# Patient Record
Sex: Male | Born: 1965 | Race: White | Hispanic: No | Marital: Single | Smoking: Never smoker
Health system: Southern US, Community
[De-identification: ages and names within clinical notes are randomized; demographics above are authoritative.]

## PROBLEM LIST (undated history)

## (undated) DIAGNOSIS — I5022 Chronic systolic (congestive) heart failure: Secondary | ICD-10-CM

## (undated) DIAGNOSIS — E669 Obesity, unspecified: Secondary | ICD-10-CM

## (undated) DIAGNOSIS — Z87898 Personal history of other specified conditions: Secondary | ICD-10-CM

## (undated) DIAGNOSIS — I480 Paroxysmal atrial fibrillation: Secondary | ICD-10-CM

## (undated) DIAGNOSIS — Z9119 Patient's noncompliance with other medical treatment and regimen: Secondary | ICD-10-CM

## (undated) DIAGNOSIS — Z91199 Patient's noncompliance with other medical treatment and regimen due to unspecified reason: Secondary | ICD-10-CM

## (undated) DIAGNOSIS — F1491 Cocaine use, unspecified, in remission: Secondary | ICD-10-CM

## (undated) DIAGNOSIS — I10 Essential (primary) hypertension: Secondary | ICD-10-CM

## (undated) HISTORY — PX: CARPAL TUNNEL RELEASE: SHX101

---

## 2015-09-20 ENCOUNTER — Emergency Department (HOSPITAL_COMMUNITY): Payer: Self-pay

## 2015-09-20 ENCOUNTER — Encounter (HOSPITAL_COMMUNITY): Payer: Self-pay

## 2015-09-20 ENCOUNTER — Inpatient Hospital Stay (HOSPITAL_COMMUNITY)
Admission: EM | Admit: 2015-09-20 | Discharge: 2015-09-23 | DRG: 308 | Disposition: A | Discharge status: Home / Self Care | Payer: Self-pay | Attending: Interventional Cardiology | Admitting: Interventional Cardiology

## 2015-09-20 DIAGNOSIS — I428 Other cardiomyopathies: Secondary | ICD-10-CM | POA: Diagnosis present

## 2015-09-20 DIAGNOSIS — I253 Aneurysm of heart: Secondary | ICD-10-CM | POA: Diagnosis present

## 2015-09-20 DIAGNOSIS — I4581 Long QT syndrome: Secondary | ICD-10-CM | POA: Diagnosis present

## 2015-09-20 DIAGNOSIS — Z7901 Long term (current) use of anticoagulants: Secondary | ICD-10-CM

## 2015-09-20 DIAGNOSIS — Z9119 Patient's noncompliance with other medical treatment and regimen: Secondary | ICD-10-CM

## 2015-09-20 DIAGNOSIS — E669 Obesity, unspecified: Secondary | ICD-10-CM | POA: Diagnosis present

## 2015-09-20 DIAGNOSIS — I5023 Acute on chronic systolic (congestive) heart failure: Secondary | ICD-10-CM

## 2015-09-20 DIAGNOSIS — Z91199 Patient's noncompliance with other medical treatment and regimen due to unspecified reason: Secondary | ICD-10-CM

## 2015-09-20 DIAGNOSIS — I110 Hypertensive heart disease with heart failure: Secondary | ICD-10-CM | POA: Diagnosis present

## 2015-09-20 DIAGNOSIS — I10 Essential (primary) hypertension: Secondary | ICD-10-CM | POA: Diagnosis present

## 2015-09-20 DIAGNOSIS — Z6841 Body Mass Index (BMI) 40.0 and over, adult: Secondary | ICD-10-CM

## 2015-09-20 DIAGNOSIS — I4891 Unspecified atrial fibrillation: Secondary | ICD-10-CM | POA: Diagnosis present

## 2015-09-20 DIAGNOSIS — Q211 Atrial septal defect: Secondary | ICD-10-CM

## 2015-09-20 DIAGNOSIS — I480 Paroxysmal atrial fibrillation: Principal | ICD-10-CM | POA: Diagnosis present

## 2015-09-20 DIAGNOSIS — F1410 Cocaine abuse, uncomplicated: Secondary | ICD-10-CM | POA: Diagnosis present

## 2015-09-20 HISTORY — DX: Chronic systolic (congestive) heart failure: I50.22

## 2015-09-20 HISTORY — DX: Personal history of other specified conditions: Z87.898

## 2015-09-20 HISTORY — DX: Morbid (severe) obesity due to excess calories: E66.01

## 2015-09-20 HISTORY — DX: Patient's noncompliance with other medical treatment and regimen due to unspecified reason: Z91.199

## 2015-09-20 HISTORY — DX: Cocaine use, unspecified, in remission: F14.91

## 2015-09-20 HISTORY — DX: Obesity, unspecified: E66.9

## 2015-09-20 HISTORY — DX: Essential (primary) hypertension: I10

## 2015-09-20 HISTORY — DX: Patient's noncompliance with other medical treatment and regimen: Z91.19

## 2015-09-20 HISTORY — DX: Paroxysmal atrial fibrillation: I48.0

## 2015-09-20 LAB — TROPONIN I
TROPONIN I: 0.03 ng/mL (ref <0.031)
TROPONIN I: 0.03 ng/mL (ref <0.031)

## 2015-09-20 LAB — CBC
HEMATOCRIT: 47 % (ref 39.0–52.0)
HEMOGLOBIN: 16.7 g/dL (ref 13.0–17.0)
MCH: 30.2 pg (ref 26.0–34.0)
MCHC: 35.5 g/dL (ref 30.0–36.0)
MCV: 85 fL (ref 78.0–100.0)
Platelets: 228 10*3/uL (ref 150–400)
RBC: 5.53 MIL/uL (ref 4.22–5.81)
RDW: 15 % (ref 11.5–15.5)
WBC: 10.3 10*3/uL (ref 4.0–10.5)

## 2015-09-20 LAB — T4, FREE: FREE T4: 1.47 ng/dL — AB (ref 0.61–1.12)

## 2015-09-20 LAB — BASIC METABOLIC PANEL
ANION GAP: 9 (ref 5–15)
BUN: 16 mg/dL (ref 6–20)
CALCIUM: 8.9 mg/dL (ref 8.9–10.3)
CO2: 26 mmol/L (ref 22–32)
Chloride: 105 mmol/L (ref 101–111)
Creatinine, Ser: 0.98 mg/dL (ref 0.61–1.24)
GLUCOSE: 109 mg/dL — AB (ref 65–99)
POTASSIUM: 4.1 mmol/L (ref 3.5–5.1)
Sodium: 140 mmol/L (ref 135–145)

## 2015-09-20 LAB — HEPATIC FUNCTION PANEL
ALBUMIN: 3.6 g/dL (ref 3.5–5.0)
ALK PHOS: 42 U/L (ref 38–126)
ALT: 74 U/L — ABNORMAL HIGH (ref 17–63)
AST: 37 U/L (ref 15–41)
BILIRUBIN TOTAL: 1.7 mg/dL — AB (ref 0.3–1.2)
Bilirubin, Direct: 0.4 mg/dL (ref 0.1–0.5)
Indirect Bilirubin: 1.3 mg/dL — ABNORMAL HIGH (ref 0.3–0.9)
Total Protein: 6.1 g/dL — ABNORMAL LOW (ref 6.5–8.1)

## 2015-09-20 LAB — I-STAT TROPONIN, ED: TROPONIN I, POC: 0.01 ng/mL (ref 0.00–0.08)

## 2015-09-20 LAB — PROTIME-INR
INR: 1.3 (ref 0.00–1.49)
Prothrombin Time: 16.3 seconds — ABNORMAL HIGH (ref 11.6–15.2)

## 2015-09-20 LAB — HEPARIN LEVEL (UNFRACTIONATED)

## 2015-09-20 LAB — RAPID URINE DRUG SCREEN, HOSP PERFORMED
AMPHETAMINES: NOT DETECTED
Barbiturates: NOT DETECTED
Benzodiazepines: NOT DETECTED
Cocaine: NOT DETECTED
Opiates: NOT DETECTED
Tetrahydrocannabinol: NOT DETECTED

## 2015-09-20 LAB — BRAIN NATRIURETIC PEPTIDE: B Natriuretic Peptide: 513.7 pg/mL — ABNORMAL HIGH (ref 0.0–100.0)

## 2015-09-20 LAB — MAGNESIUM: Magnesium: 1.8 mg/dL (ref 1.7–2.4)

## 2015-09-20 LAB — TSH: TSH: 1.304 u[IU]/mL (ref 0.350–4.500)

## 2015-09-20 LAB — MRSA PCR SCREENING: MRSA by PCR: NEGATIVE

## 2015-09-20 MED ORDER — DILTIAZEM HCL 100 MG IV SOLR
5.0000 mg/h | INTRAVENOUS | Status: DC
  Administered 2015-09-20: 10 mg/h via INTRAVENOUS
  Administered 2015-09-20 – 2015-09-21 (×2): 15 mg/h via INTRAVENOUS
  Filled 2015-09-20 (×3): qty 100

## 2015-09-20 MED ORDER — DILTIAZEM LOAD VIA INFUSION
15.0000 mg | Freq: Once | INTRAVENOUS | Status: AC
  Administered 2015-09-20: 15 mg via INTRAVENOUS
  Filled 2015-09-20: qty 15

## 2015-09-20 MED ORDER — SODIUM CHLORIDE 0.9 % IJ SOLN
3.0000 mL | Freq: Two times a day (BID) | INTRAMUSCULAR | Status: DC
  Administered 2015-09-21 – 2015-09-22 (×3): 3 mL via INTRAVENOUS

## 2015-09-20 MED ORDER — ACETAMINOPHEN 325 MG PO TABS
650.0000 mg | ORAL_TABLET | ORAL | Status: DC | PRN
  Administered 2015-09-21 – 2015-09-22 (×2): 650 mg via ORAL
  Filled 2015-09-20 (×2): qty 2

## 2015-09-20 MED ORDER — METOPROLOL TARTRATE 1 MG/ML IV SOLN
5.0000 mg | INTRAVENOUS | Status: AC
  Administered 2015-09-20: 5 mg via INTRAVENOUS
  Filled 2015-09-20: qty 5

## 2015-09-20 MED ORDER — DILTIAZEM LOAD VIA INFUSION
20.0000 mg | Freq: Once | INTRAVENOUS | Status: DC
Start: 2015-09-20 — End: 2015-09-20
  Filled 2015-09-20: qty 20

## 2015-09-20 MED ORDER — DILTIAZEM HCL 100 MG IV SOLR: 5.0000 mg/h | INTRAVENOUS | Status: DC

## 2015-09-20 MED ORDER — FUROSEMIDE 10 MG/ML IJ SOLN
20.0000 mg | Freq: Once | INTRAMUSCULAR | Status: AC
  Administered 2015-09-20: 20 mg via INTRAVENOUS
  Filled 2015-09-20: qty 2

## 2015-09-20 MED ORDER — SODIUM CHLORIDE 0.9 % IJ SOLN: 3.0000 mL | INTRAMUSCULAR | Status: DC | PRN

## 2015-09-20 MED ORDER — HEPARIN BOLUS VIA INFUSION
4000.0000 [IU] | Freq: Once | INTRAVENOUS | Status: AC
  Administered 2015-09-20: 4000 [IU] via INTRAVENOUS
  Filled 2015-09-20: qty 4000

## 2015-09-20 MED ORDER — ONDANSETRON HCL 4 MG/2ML IJ SOLN
4.0000 mg | Freq: Four times a day (QID) | INTRAMUSCULAR | Status: DC | PRN
  Administered 2015-09-22: 4 mg via INTRAVENOUS

## 2015-09-20 MED ORDER — DILTIAZEM HCL 100 MG IV SOLR
5.0000 mg/h | INTRAVENOUS | Status: DC
  Administered 2015-09-20: 5 mg/h via INTRAVENOUS
  Filled 2015-09-20: qty 100

## 2015-09-20 MED ORDER — SODIUM CHLORIDE 0.9 % IV SOLN: 250.0000 mL | INTRAVENOUS | Status: DC | PRN

## 2015-09-20 MED ORDER — HEPARIN (PORCINE) IN NACL 100-0.45 UNIT/ML-% IJ SOLN
2300.0000 [IU]/h | INTRAMUSCULAR | Status: AC
  Administered 2015-09-20: 1500 [IU]/h via INTRAVENOUS
  Administered 2015-09-21: 1950 [IU]/h via INTRAVENOUS
  Administered 2015-09-21: 2250 [IU]/h via INTRAVENOUS
  Administered 2015-09-22 (×3): 2600 [IU]/h via INTRAVENOUS
  Filled 2015-09-20 (×7): qty 250

## 2015-09-20 MED ORDER — CARVEDILOL 3.125 MG PO TABS
3.1250 mg | ORAL_TABLET | Freq: Two times a day (BID) | ORAL | Status: DC
  Administered 2015-09-20 – 2015-09-21 (×2): 3.125 mg via ORAL
  Filled 2015-09-20 (×2): qty 1

## 2015-09-20 NOTE — ED Notes (Signed)
Pt ambulatory w/ steady gait to restroom. 

## 2015-09-20 NOTE — ED Notes (Signed)
Pt ambulatory w/ steady gait to restroom. 

## 2015-09-20 NOTE — ED Notes (Signed)
Admitting at bedside 

## 2015-09-20 NOTE — ED Notes (Signed)
EDP at bedside  

## 2015-09-20 NOTE — Progress Notes (Signed)
ANTICOAGULATION CONSULT NOTE  Pharmacy Consult for heparin Indication: atrial fibrillation  No Known Allergies  Patient Measurements: Height: 5\' 11"  (180.3 cm) Weight: (!) 323 lb 3.1 oz (146.6 kg) IBW/kg (Calculated) : 75.3 Heparin Dosing Weight: 106 kg  Vital Signs: Temp: 98.3 F (36.8 C) (11/14 2000) Temp Source: Oral (11/14 2000) BP: 135/108 mmHg (11/14 1900) Pulse Rate: 112 (11/14 1900)  Labs:  Recent Labs  09/20/15 1015 09/20/15 1818 09/20/15 2229  HGB 16.7  --   --   HCT 47.0  --   --   PLT 228  --   --   LABPROT  --  16.3*  --   INR  --  1.30  --   HEPARINUNFRC  --   --  <0.10*  CREATININE 0.98  --   --   TROPONINI  --  0.03 0.03    Estimated Creatinine Clearance: 132.4 mL/min (by C-G formula based on Cr of 0.98).  Assessment: 49 y.o. male with Afib for heparin  Goal of Therapy:  Heparin level 0.3-0.7 units/ml Monitor platelets by anticoagulation protocol: Yes   Plan:  Heparin 3000 units IV bolus, then increase heparin 1950 units/hr Follow-up am labs.   Geannie Risen, PharmD, BCPS   09/20/2015 11:59 PM

## 2015-09-20 NOTE — ED Notes (Signed)
Pt c/o intermittent shortness of breath x 2 weeks, unable to sleep d/t shortness of breath. Pt c/o nausea/upset stomach/bloated for 2 weeks. Pt hx a fib and htn. Pt was on xarelto, 2 bp meds, and lasix, baby aspirin - has not taken in 6 months.   Pt took 2 baby aspirins today.

## 2015-09-20 NOTE — H&P (Signed)
History and Physical  Patient ID: Bynum Reigel MRN: [id number], DOB: 03-14-1966 Date of Encounter: 09/20/2015, 2:02 PM Primary Physician: No PCP Per Patient Primary Cardiologist: New - patient lives between [place] and [place], but has been working in [employer] Complaint: SOB, orthopnea Reason for Admission: atrial fib RVR, CHF  HPI: Mr. Sahm is a 49 y/o M with history of essential HTN, atrial fib, morbid obesity, prior cocaine abuse, chronic systolic CHF (EF 12-24% in 08/2014) who presented to [facility] with atrial fib RVR. 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   In 08/2014, he was admitted for similar symptoms at [facility] in [place]. He was diagnosed with AF RVR in the setting of excess diet Coke - he had consumed more than usual as he'd taken 4 planes in 2 days for work meetings. UDS was also positive for cocaine during that admission per [facility]. He underwent TEE / DCCV the morning after admission 08/21/2014 with EF 30-35% / LV- normal size with moderate lobar hypokinesis / Mod MR / successful DCCV to normal sinus rhythm. He says he did not go back into atrial fib after the DCCV so it's unclear why Tikosyn specifically was chosen other than low EF. He recalls having a chemical test that did not show any blockages - hospital notes indicate CTA that ruled out PE, but plans for ischemic eval as outpatient. He was discharged with carvedilol, Tikosyn BID, enalapril, Lasix 20mg  daily, and Xarelto. He found it very difficult to keep up with the Tikosyn because of his travel since not every pharmacy carries it. He was on his meds for about 6 months but then lost his insurance, so stopped them about 6 months ago. He thought he had an episode of AF in January, but when he went to the ER to get checked out in [place], everything apparently looked OK. He has since cut down his diet Coke intake to 1/day. He  is forthcoming about the prior cocaine use but denies any recent use. He drinks 3-4 drinks (wine) on Friday and Saturday nights. No h/o tobacco and no known family history of heart disease.  Regarding this admission, he presented to the hospital today for evaluation of 2-3 weeks of progressive dyspnea and orthopnea. He has also had a vague intermittent chest pain without pattern. It is not particularly exertional in nature, tends to happen at random, and he says isn't something he's even paid that much attention to - it's more of the orthopnea that's really bothered him. He also notices when he eats, he gets gas easily. He's lost 10lbs in the last few weeks intentionally. He seems aware of his obesity and the need to lose weight. Labs are notable for glucose 109, BNP 513, troponin neg x 1, otherwise CBC/BMET unremarkable. CXR showed borderline enlarged cardiac silhouette, and suspected mild CHF. Presenting BP was 161/116 and HR 140s. He received 15mg  IV diltiazem along with a drip currently at 7.5mg /hr with HR 120s-140s. Gave 5mg  IV Lopressor with HR ?105. He states he currently feels better than when he first came in.   Past Medical History  Diagnosis Date  . Essential hypertension   . Paroxysmal atrial fibrillation (HCC)     a. Dx in [place] ([place]) in 2015, in setting of excess Diet Coke. s/p TEE/DCCV and then Tikosyn/Xarelto.  . Morbid obesity (HCC)   . Chronic systolic CHF (congestive heart failure) (HCC)  a. [place] 08/21/2014: TEE / DCCV- EF: 30-35% / LV- normal size with moderate lobar hypokinesis / Mod MR / Successful DCCV to normal sinus rhythm.  . History of cocaine use     Surgical History:  Past Surgical History  Procedure Laterality Date  . Carpal tunnel release       Home Meds: Prior to Admission medications   Not on File    Allergies: No Known Allergies  Social History   Social History  . Marital Status: Single    Spouse Name: N/A  . Number of Children: N/A  . Years of  Education: N/A   Occupational History  .      Travel Transport planner   Social History Main Topics  . Smoking status: Never Smoker   . Smokeless tobacco: Never Used  . Alcohol Use: 4.2 oz/week    7 Glasses of wine per week     Comment: Drinks Fri/Sat - 3-4 glasses of wine at a time  . Drug Use: Yes     Comment: H/o prior cocaine use  . Sexual Activity: Not on file   Other Topics Concern  . Not on file   Social History Narrative     Family History  Problem Relation Age of Onset  . Heart disease Neg Hx     Review of Systems: No bleeding, nausea, syncope. All other systems reviewed and are otherwise negative except as noted above.  Labs:   Lab Results  Component Value Date   WBC 10.3 09/20/2015   HGB 16.7 09/20/2015   HCT 47.0 09/20/2015   MCV 85.0 09/20/2015   PLT 228 09/20/2015     Recent Labs Lab 09/20/15 1015  NA 140  K 4.1  CL 105  CO2 26  BUN 16  CREATININE 0.98  CALCIUM 8.9  GLUCOSE 109*   Radiology/Studies:  Dg Chest Port 1 View  09/20/2015  CLINICAL DATA:  Short of breath.  Atrial fibrillation. EXAM: PORTABLE CHEST 1 VIEW COMPARISON:  None. FINDINGS: Cardiac silhouette borderline enlarged. No mediastinal or hilar masses or evidence of adenopathy. There is central and lung base interstitial prominence accentuated by low lung volumes. There is additional linear opacity at the right lung base that is likely due to atelectasis. No convincing pneumonia. No pleural effusion or pneumothorax. Bony thorax is demineralized but grossly intact. IMPRESSION: 1. Cardiac silhouette borderline enlarged. Central and lower lung zone interstitial prominence. Mild congestive heart failure suspected. No evidence of pneumonia. Electronically Signed   By: Amie Portland M.D.   On: 09/20/2015 11:28   Wt Readings from Last 3 Encounters:  09/20/15 300 lb (136.079 kg)    EKG: atrial fib 139bpm possible prior anteroseptal infarct, QTc by EKG readout, nonspecific ST-T  changes  Physical Exam: Blood pressure 114/90, pulse 119, temperature 98 F (36.7 C), temperature source Oral, resp. rate 23, height  (1.803 m), weight 300 lb (136.079 kg), SpO2 94 %. General: Well developed obese WM in no acute distress. Head: Normocephalic, atraumatic, sclera non-icteric, no xanthomas, nares are without discharge.  Neck: Negative for carotid bruits. JVD not elevated. Lungs: Bilateral crackles at bases without wheezes or rhonchi. Breathing is unlabored. Heart: Irregularly irregular, tachycardic, with S1 S2. No murmurs, rubs, or gallops appreciated. Abdomen: Soft, non-tender, non-distended with normoactive bowel sounds. No hepatomegaly. No rebound/guarding. No obvious abdominal masses. Msk:  Strength and tone appear normal for age. Extremities: No clubbing or cyanosis. No edema.  Distal pedal pulses are 2+ and equal bilaterally. Neuro: Alert  and oriented X 3. No focal deficit. No facial asymmetry. Moves all extremities spontaneously. Psych:  Responds to questions appropriately with a normal affect.    ASSESSMENT AND PLAN:   1. Paroxysmal atrial fibrillation with RVR - suspect recent symptoms are due to AF contributing to CHF. Will cycle troponins, check A1C/Mg/TSH/UDS, and check lipids in AM. CHADSVASC 2. Will place on IV heparin per pharmacy and consult care management to determine the best agent cost-wise for him given that he has no insurance until January. Coumadin would be difficult for him to manage with his frequent travel schedule and office copays. Continue diltiazem drip for now and add back low-dose beta blocker. Long-term, diltiazem is not the best choice with his LV dysfunction but will continue it acutely to see if he converts on his own overnight. If he continues to feel better, can consider outpatient DCCV once compliance with anticoagulation is established. If there is evidence of worsening decompensation then we can consider TEE/DCCV this admission.   2.  Acute on chronic systolic CHF - will give  IV Lasix in the ER. Suspect this will improve with better rate control. Will recheck echo when HR lower to re-assess LV function. He will need ischemic evaluation at some point - will d/w MD. Other etiologies include tachy-mediated cardiomyopathy or related to prior cocaine abuse. Anticipate resuming ACEI once we see good control of his HR.  3. Essential HTN - follow BP with above measures.   4. Morbid obesity (BMI 41.9) - he is eager to hear that once this is settled out, he will be allowed to work out again. He understands that his weight carries substantial risk to recurrent arrhythmia if not controlled. Would benefit from sleep study as outpatient if we can arrange, given PAF.  5. Prior cocaine abuse - check UDS. He denies any recent or active use.  Signed, Laurann Montana PA-C 09/20/2015, 2:02 PM Pager: [phone]  I have examined the patient and reviewed assessment and plan and discussed with patient.  Agree with above as stated.  Patient with recurret AFib.  Difficult social situation due to his travel and h/o drug use.  He needs to avoid drugs.  He wants to lose weight and exerise.  First step is to rate control and diurese.  Will need to get him on anticoagulation long term.  He was on Xarelto in the past.  Will try to get a discount card.  He may need Eliquis instead since he has taken Xarelto in the past. Hopefully, he will convert on his own.  Once stable, I would consider outpatient ischemic testing.  Kizzy Olafson S.

## 2015-09-20 NOTE — ED Provider Notes (Signed)
CSN: [id number]     Arrival date & time 09/20/15  1000 History   First MD Initiated Contact with Patient 09/20/15 1014     Chief Complaint  Patient presents with  . Shortness of Breath  . Atrial Fibrillation     The history is provided by the patient. No language interpreter was used.   Luke Butler is a 49 year old man with history of atrial fibrillation here for evaluation of shortness of breath. He has been off of his medications for A. fib for the last 6 months due to insurance reasons. For the last 2-1/2-3 weeks he reports increased shortness of breath, rapid heart rate, orthopnea and dyspnea on exertion. He denies any fevers, lower extremity edema, cough. He does have difficulty with upset stomach/nausea with meals. He is taking 2 baby aspirins twice a day, no additional medications. He drinks alcohol on the weekends, none during the week. Symptoms are moderate, constant, worsening.  Past Medical History  Diagnosis Date  . Hypertension   . Atrial fibrillation (HCC)    History reviewed. No pertinent past surgical history. History reviewed. No pertinent family history. Social History  Substance Use Topics  . Smoking status: Never Smoker   . Smokeless tobacco: Never Used  . Alcohol Use: 4.2 oz/week    7 Glasses of wine per week    Review of Systems  All other systems reviewed and are negative.     Allergies  Review of patient's allergies indicates no known allergies.  Home Medications   Prior to Admission medications   Not on File   BP 161/116 mmHg  Pulse 138  Temp(Src) 98 F (36.7 C) (Oral)  Resp 20  Ht 5\' 11"  (1.803 m)  Wt 300 lb (136.079 kg)  BMI 41.86 kg/m2  SpO2 97% Physical Exam  Constitutional: He is oriented to person, place, and time. He appears well-developed and well-nourished.  HENT:  Head: Normocephalic and atraumatic.  Cardiovascular:  No murmur heard. Tachycardic and irregular  Pulmonary/Chest: Effort normal and breath sounds normal. No  respiratory distress.  Abdominal: Soft. There is no tenderness. There is no rebound and no guarding.  Musculoskeletal: He exhibits no tenderness.  1+ pitting edema in bilateral lower extremities  Neurological: He is alert and oriented to person, place, and time.  Skin: Skin is warm and dry.  Psychiatric:  Anxious appearing  Nursing note and vitals reviewed.   ED Course  Procedures (including critical care time) Labs Review Labs Reviewed  BASIC METABOLIC PANEL - Abnormal; Notable for the following:    Glucose, Bld 109 (*)    All other components within normal limits  BRAIN NATRIURETIC PEPTIDE - Abnormal; Notable for the following:    B Natriuretic Peptide 513.7 (*)    All other components within normal limits  MRSA PCR SCREENING  CBC  URINE RAPID DRUG SCREEN, HOSP PERFORMED  TSH  T4, FREE  MAGNESIUM  TROPONIN I  TROPONIN I  TROPONIN I  HEMOGLOBIN A1C  PROTIME-INR  HEPATIC FUNCTION PANEL  HEPARIN LEVEL (UNFRACTIONATED)  BASIC METABOLIC PANEL  LIPID PANEL  CBC  HEPARIN LEVEL (UNFRACTIONATED)  Rosezena Sensor, ED    Imaging Review Dg Chest Port 1 View  09/20/2015  CLINICAL DATA:  Short of breath.  Atrial fibrillation. EXAM: PORTABLE CHEST 1 VIEW COMPARISON:  None. FINDINGS: Cardiac silhouette borderline enlarged. No mediastinal or hilar masses or evidence of adenopathy. There is central and lung base interstitial prominence accentuated by low lung volumes. There is additional linear opacity at the right  lung base that is likely due to atelectasis. No convincing pneumonia. No pleural effusion or pneumothorax. Bony thorax is demineralized but grossly intact. IMPRESSION: 1. Cardiac silhouette borderline enlarged. Central and lower lung zone interstitial prominence. Mild congestive heart failure suspected. No evidence of pneumonia. Electronically Signed   By: Amie Portland M.D.   On: 09/20/2015 11:28   I have personally reviewed and evaluated these images and lab results as  part of my medical decision-making.   EKG Interpretation   Date/Time:  Monday September 20 2015 10:09:59 EST Ventricular Rate:  139 PR Interval:    QRS Duration: 87 QT Interval:  355 QTC Calculation: 540 R Axis:   64 Text Interpretation:  Atrial fibrillation Anteroseptal infarct, age  indeterminate Borderline repolarization abnormality Prolonged QT interval  Confirmed by Lincoln Brigham [phone]) on 09/20/2015 10:34:05 AM      MDM   Final diagnoses:  Atrial fibrillation with RVR [facility])    Patient here for evaluation of shortness of breath. The patient is in A. fib with RVR with mild congestive heart failure. Rate is improved on diltiazem drip after bolus and drip but is still not completely controlled. Discussed with cardiology regarding admission for further treatment.    Tilden Fossa, MD 09/20/15 [phone]

## 2015-09-20 NOTE — Progress Notes (Signed)
ANTICOAGULATION CONSULT NOTE - Initial Consult  Pharmacy Consult for heparin Indication: atrial fibrillation  No Known Allergies  Patient Measurements: Height: 5\' 11"  (180.3 cm) Weight: 300 lb (136.079 kg) IBW/kg (Calculated) : 75.3 Heparin Dosing Weight: 106 kg  Vital Signs: Temp: 98 F (36.7 C) (11/14 1011) Temp Source: Oral (11/14 1011) BP: 120/81 mmHg (11/14 1611) Pulse Rate: 121 (11/14 1611)  Labs:  Recent Labs  09/20/15 1015  HGB 16.7  HCT 47.0  PLT 228  CREATININE 0.98    Estimated Creatinine Clearance: 127 mL/min (by C-G formula based on Cr of 0.98).   Medical History: Past Medical History  Diagnosis Date  . Essential hypertension   . Paroxysmal atrial fibrillation (HCC)     a. Dx in [place] ([place] in [place], in setting of excess Diet Coke. s/p TEE/DCCV and then Tikosyn/Xarelto.  . Morbid obesity (HCC)   . Chronic systolic CHF (congestive heart failure) (HCC)     a. [place] 08/21/2014: TEE / DCCV- EF: 30-35% / LV- normal size with moderate lobar hypokinesis / Mod MR / Successful DCCV to normal sinus rhythm.  . History of cocaine use     Assessment: 49 yo m presenting to the ED on 11/14 in afib with RVR.  Pharmacy is consulted to dose heparin for afib. Patient is on Tikosyn and Xarelto at home, but has not taken his medications in ~6 months.  Hgb 16.7, plts 228.   Goal of Therapy:  Heparin level 0.3-0.7 units/ml Monitor platelets by anticoagulation protocol: Yes   Plan:  Heparin bolus 4,000 units x 1 Heparin infusion 1500 units/hr  6-hr HL @ 2300 Daily HL, CBC Monitor s/sx of bleeding  Cassie L. Roseanne Reno, PharmD Clinical Pharmacy Resident Pager: [phone] 09/20/2015 4:45 PM

## 2015-09-21 ENCOUNTER — Inpatient Hospital Stay (HOSPITAL_COMMUNITY): Payer: Self-pay

## 2015-09-21 DIAGNOSIS — I4891 Unspecified atrial fibrillation: Secondary | ICD-10-CM

## 2015-09-21 LAB — LIPID PANEL
CHOL/HDL RATIO: 4.2 ratio
CHOLESTEROL: 131 mg/dL (ref 0–200)
HDL: 31 mg/dL — AB (ref >40)
LDL Cholesterol: 85 mg/dL (ref 0–99)
Triglycerides: 76 mg/dL (ref <150)
VLDL: 15 mg/dL (ref 0–40)

## 2015-09-21 LAB — BASIC METABOLIC PANEL
ANION GAP: 10 (ref 5–15)
BUN: 11 mg/dL (ref 6–20)
CHLORIDE: 103 mmol/L (ref 101–111)
CO2: 25 mmol/L (ref 22–32)
Calcium: 8.7 mg/dL — ABNORMAL LOW (ref 8.9–10.3)
Creatinine, Ser: 0.91 mg/dL (ref 0.61–1.24)
GFR calc non Af Amer: >60 mL/min (ref >60)
Glucose, Bld: 95 mg/dL (ref 65–99)
POTASSIUM: 4.3 mmol/L (ref 3.5–5.1)
SODIUM: 138 mmol/L (ref 135–145)

## 2015-09-21 LAB — CBC
HEMATOCRIT: 44.3 % (ref 39.0–52.0)
HEMOGLOBIN: 15.3 g/dL (ref 13.0–17.0)
MCH: 29.4 pg (ref 26.0–34.0)
MCHC: 34.5 g/dL (ref 30.0–36.0)
MCV: 85.2 fL (ref 78.0–100.0)
PLATELETS: 198 10*3/uL (ref 150–400)
RBC: 5.2 MIL/uL (ref 4.22–5.81)
RDW: 14.7 % (ref 11.5–15.5)
WBC: 7.4 10*3/uL (ref 4.0–10.5)

## 2015-09-21 LAB — HEPARIN LEVEL (UNFRACTIONATED)
HEPARIN UNFRACTIONATED: 0.14 [IU]/mL — AB (ref 0.30–0.70)
Heparin Unfractionated: 0.15 IU/mL — ABNORMAL LOW (ref 0.30–0.70)

## 2015-09-21 LAB — HEMOGLOBIN A1C
HEMOGLOBIN A1C: 5 % (ref 4.8–5.6)
Mean Plasma Glucose: 97 mg/dL

## 2015-09-21 LAB — TROPONIN I: Troponin I: <0.03 ng/mL (ref <0.031)

## 2015-09-21 MED ORDER — CARVEDILOL 6.25 MG PO TABS
6.2500 mg | ORAL_TABLET | Freq: Two times a day (BID) | ORAL | Status: DC
  Administered 2015-09-21: 3.125 mg via ORAL
  Administered 2015-09-21 – 2015-09-22 (×2): 6.25 mg via ORAL
  Filled 2015-09-21 (×3): qty 1

## 2015-09-21 MED ORDER — HEPARIN BOLUS VIA INFUSION
2000.0000 [IU] | Freq: Once | INTRAVENOUS | Status: DC
  Filled 2015-09-21: qty 2000

## 2015-09-21 MED ORDER — SODIUM CHLORIDE 0.9 % IV SOLN: 250.0000 mL | INTRAVENOUS | Status: DC

## 2015-09-21 MED ORDER — FUROSEMIDE 10 MG/ML IJ SOLN
40.0000 mg | Freq: Once | INTRAMUSCULAR | Status: AC
  Administered 2015-09-21: 40 mg via INTRAVENOUS
  Filled 2015-09-21: qty 4

## 2015-09-21 MED ORDER — HEPARIN BOLUS VIA INFUSION
2000.0000 [IU] | Freq: Once | INTRAVENOUS | Status: AC
  Administered 2015-09-21: 2000 [IU] via INTRAVENOUS
  Filled 2015-09-21: qty 2000

## 2015-09-21 MED ORDER — PERFLUTREN LIPID MICROSPHERE
INTRAVENOUS | Status: AC
  Administered 2015-09-21: 2 mL
  Filled 2015-09-21: qty 10

## 2015-09-21 MED ORDER — SODIUM CHLORIDE 0.9 % IJ SOLN
3.0000 mL | Freq: Two times a day (BID) | INTRAMUSCULAR | Status: DC
  Administered 2015-09-21 – 2015-09-22 (×2): 3 mL via INTRAVENOUS

## 2015-09-21 MED ORDER — SODIUM CHLORIDE 0.9 % IJ SOLN: 3.0000 mL | INTRAMUSCULAR | Status: DC | PRN

## 2015-09-21 MED ORDER — HEPARIN BOLUS VIA INFUSION
3000.0000 [IU] | Freq: Once | INTRAVENOUS | Status: AC
  Administered 2015-09-21: 3000 [IU] via INTRAVENOUS
  Filled 2015-09-21: qty 3000

## 2015-09-21 MED ORDER — HYDROCORTISONE 1 % EX CREA
1.0000 "application " | TOPICAL_CREAM | Freq: Three times a day (TID) | CUTANEOUS | Status: DC | PRN
  Filled 2015-09-21: qty 28

## 2015-09-21 MED ORDER — DILTIAZEM HCL 60 MG PO TABS
90.0000 mg | ORAL_TABLET | Freq: Four times a day (QID) | ORAL | Status: DC
  Administered 2015-09-21 – 2015-09-22 (×7): 90 mg via ORAL
  Filled 2015-09-21 (×11): qty 1

## 2015-09-21 NOTE — Progress Notes (Signed)
SUBJECTIVE:  Feels better, but has palpitations if he walks to the bathroom.  OBJECTIVE:   Vitals:   Filed Vitals:   09/21/15 0010 09/21/15 0329 09/21/15 0740 09/21/15 1126  BP: 123/86 114/99 132/98 137/98  Pulse:      Temp:  98.1 F (36.7 C) 97.9 F (36.6 C) 98.1 F (36.7 C)  TempSrc:  Oral Oral Oral  Resp:  Height:      Weight:  322 lb 1.5 oz (146.1 kg)    SpO2: 93% 95% 96% 97%   I&O's:   Intake/Output Summary (Last 24 hours) at 09/21/15 1138 Last data filed at 09/21/15 0800  Gross per 24 hour  Intake 1548.21 ml  Output   2475 ml  Net -926.79 ml   TELEMETRY: Reviewed telemetry pt in AFib, intermittent RVR:     PHYSICAL EXAM General: Well developed, well nourished, in no acute distress Head:   Normal cephalic and atramatic  Lungs:   Clear bilaterally to auscultation. Heart:   Irregularly irregular S1 S2  No JVD.   Abdomen: abdomen soft and non-tender Msk:  Back normal,  Normal strength and tone for age. Extremities:   No edema.   Neuro: Alert and oriented. Psych:  Normal affect, responds appropriately Skin: No rash   LABS: Basic Metabolic Panel:  Recent Labs  16/10/96 1015 09/20/15 1818 09/21/15 0755  NA 140  --  138  K 4.1  --  4.3  CL 105  --  103  CO2 26  --  25  GLUCOSE 109*  --  95  BUN 16  --  11  CREATININE 0.98  --  0.91  CALCIUM 8.9  --  8.7*  MG  --  1.8  --    Liver Function Tests:  Recent Labs  09/20/15 1818  AST 37  ALT 74*  ALKPHOS 42  BILITOT 1.7*  PROT 6.1*  ALBUMIN 3.6   No results for input(s): LIPASE, AMYLASE in the last 72 hours. CBC:  Recent Labs  09/20/15 1015 09/21/15 0755  WBC 10.3 7.4  HGB 16.7 15.3  HCT 47.0 44.3  MCV 85.0 85.2  PLT 228 198   Cardiac Enzymes:  Recent Labs  09/20/15 1818 09/20/15 2229 09/21/15 0755  TROPONINI 0.03 0.03 <0.03   BNP: Invalid input(s): POCBNP D-Dimer: No results for input(s): DDIMER in the last 72 hours. Hemoglobin A1C:  Recent Labs   09/20/15 1818  HGBA1C 5.0   Fasting Lipid Panel:  Recent Labs  09/21/15 0735  CHOL 131  HDL 31*  LDLCALC 85  TRIG 76  CHOLHDL 4.2   Thyroid Function Tests:  Recent Labs  09/20/15 1818  TSH 1.304   Anemia Panel: No results for input(s): VITAMINB12, FOLATE, FERRITIN, TIBC, IRON, RETICCTPCT in the last 72 hours. Coag Panel:   Lab Results  Component Value Date   INR 1.30 09/20/2015    RADIOLOGY: Dg Chest Port 1 View  09/20/2015  CLINICAL DATA:  Short of breath.  Atrial fibrillation. EXAM: PORTABLE CHEST 1 VIEW COMPARISON:  None. FINDINGS: Cardiac silhouette borderline enlarged. No mediastinal or hilar masses or evidence of adenopathy. There is central and lung base interstitial prominence accentuated by low lung volumes. There is additional linear opacity at the right lung base that is likely due to atelectasis. No convincing pneumonia. No pleural effusion or pneumothorax. Bony thorax is demineralized but grossly intact. IMPRESSION: 1. Cardiac silhouette borderline enlarged. Central and lower lung zone interstitial prominence. Mild congestive heart failure suspected.  No evidence of pneumonia. Electronically Signed   By: Amie Portland M.D.   On: 09/20/2015 11:28      ASSESSMENT /PLAN:    1) AFib: Somewhat difficult to rate control.  Increase Coreg to 6.25 BID.  Change dilt to oral.  Plan for TEE/CV tomorrow.  We stressed the importance of longterm anticoagulation.  Case manager is working on getting him free NOAC.  Continue IV heparin for now.  NOAC to start post cardioversion.  2) Echo pending.  Prior low EF.  Additional dose of Lasix given.  Check BMet in AM.  Longterm, he needs weight loss.    This patients CHA2DS2-VASc Score and unadjusted Ischemic Stroke Rate (% per year) is equal to 2.2 % stroke rate/year from a score of 2  Above score calculated as 1 point each if present [CHF, HTN, DM, Vascular=MI/PAD/Aortic Plaque, Age if 65-74, or Male] Above score calculated  as 2 points each if present [Age > 75, or Stroke/TIA/TE]    Corky Crafts, MD  09/21/2015  11:38 AM

## 2015-09-21 NOTE — Progress Notes (Signed)
Echocardiogram 2D Echocardiogram with Definity has been performed.  Nolon Rod 09/21/2015, 2:10 PM

## 2015-09-21 NOTE — Discharge Instructions (Signed)

## 2015-09-21 NOTE — Progress Notes (Signed)
ANTICOAGULATION CONSULT NOTE  Pharmacy Consult for heparin Indication: atrial fibrillation  No Known Allergies  Patient Measurements: Height: 5\' 11"  (180.3 cm) Weight: (!) 322 lb 1.5 oz (146.1 kg) IBW/kg (Calculated) : 75.3 Heparin Dosing Weight: 106 kg  Vital Signs: Temp: 98.1 F (36.7 C) (11/15 1700) Temp Source: Oral (11/15 1700) BP: 134/99 mmHg (11/15 1700)  Labs:  Recent Labs  09/20/15 1015 09/20/15 1818 09/20/15 2229 09/21/15 0755 09/21/15 0848 09/21/15 1610  HGB 16.7  --   --  15.3  --   --   HCT 47.0  --   --  44.3  --   --   PLT 228  --   --  198  --   --   LABPROT  --  16.3*  --   --   --   --   INR  --  1.30  --   --   --   --   HEPARINUNFRC  --   --  <0.10*  --  0.14* 0.15*  CREATININE 0.98  --   --  0.91  --   --   TROPONINI  --  0.03 0.03 <0.03  --   --     Estimated Creatinine Clearance: 142.3 mL/min (by C-G formula based on Cr of 0.91).  Assessment: 49 y.o. male with Afib currently on heparin drip 2250 uts/hr HL remains < goal 0.15.  CBC stable no bleeding noted.   Goal of Therapy:  Heparin level 0.3-0.7 units/ml Monitor platelets by anticoagulation protocol: Yes   Plan:  Heparin 2000 units IV bolus, then increase heparin 2600 units/hr Check daily HL, CBC   Leota Sauers Pharm.D. CPP, BCPS Clinical Pharmacist [phone] 09/21/2015 6:29 PM

## 2015-09-21 NOTE — Progress Notes (Signed)
ANTICOAGULATION CONSULT NOTE  Pharmacy Consult for heparin Indication: atrial fibrillation  No Known Allergies  Patient Measurements: Height: 5\' 11"  (180.3 cm) Weight: (!) 322 lb 1.5 oz (146.1 kg) IBW/kg (Calculated) : 75.3 Heparin Dosing Weight: 106 kg  Vital Signs: Temp: 97.9 F (36.6 C) (11/15 0740) Temp Source: Oral (11/15 0740) BP: 132/98 mmHg (11/15 0740)  Labs:  Recent Labs  09/20/15 1015 09/20/15 1818 09/20/15 2229 09/21/15 0755 09/21/15 0848  HGB 16.7  --   --  15.3  --   HCT 47.0  --   --  44.3  --   PLT 228  --   --  198  --   LABPROT  --  16.3*  --   --   --   INR  --  1.30  --   --   --   HEPARINUNFRC  --   --  <0.10*  --  0.14*  CREATININE 0.98  --   --  0.91  --   TROPONINI  --  0.03 0.03 <0.03  --     Estimated Creatinine Clearance: 142.3 mL/min (by C-G formula based on Cr of 0.91).  Assessment: 49 y.o. male with Afib currently on heparin. Follow up level this am is still below goal at 0.14. No bleeding issues noted, CBC wnl. Will increase rate but plan to transition to DOAC soon based on cost/help from case management.  Goal of Therapy:  Heparin level 0.3-0.7 units/ml Monitor platelets by anticoagulation protocol: Yes   Plan:  Heparin 2000 units IV bolus, then increase heparin 2250 units/hr Check HL this afternoon if continued Follow up transition to oral [facility]  Sheppard Coil PharmD., BCPS Clinical Pharmacist Pager [phone] 09/21/2015 10:07 AM

## 2015-09-21 NOTE — Care Management Note (Signed)
Case Management Note  Patient Details  Name: Tearle Ciccone MRN: [id number] Date of Birth: 06-May-1966  Subjective/Objective:     Adm w at fib               Action/Plan: lives in [place] w friend, travels as Tax adviser. Changed jobs recently and ins does not start til jan 2017.   Expected Discharge Date:                  Expected Discharge Plan:  Home/Self Care  In-House Referral:     Discharge planning Services  CM Consult, Indigent Health Clinic, Medication Assistance  Post Acute Care Choice:    Choice offered to:     DME Arranged:    DME Agency:     HH Arranged:    HH Agency:     Status of Service:     Medicare Important Message Given:    Date Medicare IM Given:    Medicare IM give by:    Date Additional Medicare IM Given:    Additional Medicare Important Message give by:     If discussed at Long Length of Stay Meetings, dates discussed:    Additional Comments: ur review. Gave pt 30day free and copay cards for eliquis and xarelto. Left pt pt assist forms for both meds. He will have about 2weeks without ins after 30day free cards run out. He may just pruchase 2 weeks worth but has pt assist forms. Gave pt inform on [place] and wellness clinic. He travels to [place] to work and will follow up w cardiol here and may use clinic for pcp.   Hanley Hays, RN 09/21/2015, 10:43 AM

## 2015-09-22 ENCOUNTER — Inpatient Hospital Stay (HOSPITAL_COMMUNITY): Payer: Self-pay

## 2015-09-22 ENCOUNTER — Inpatient Hospital Stay (HOSPITAL_COMMUNITY): Payer: Self-pay | Admitting: Anesthesiology

## 2015-09-22 ENCOUNTER — Encounter (HOSPITAL_COMMUNITY): Payer: Self-pay

## 2015-09-22 ENCOUNTER — Encounter (HOSPITAL_COMMUNITY)
Admission: EM | Disposition: A | Discharge status: Home / Self Care | Payer: Self-pay | Source: Home / Self Care | Attending: Interventional Cardiology

## 2015-09-22 DIAGNOSIS — I4891 Unspecified atrial fibrillation: Secondary | ICD-10-CM

## 2015-09-22 HISTORY — PX: CARDIOVERSION: SHX1299

## 2015-09-22 HISTORY — PX: TEE WITHOUT CARDIOVERSION: SHX5443

## 2015-09-22 LAB — CBC
HEMATOCRIT: 42.3 % (ref 39.0–52.0)
Hemoglobin: 14.9 g/dL (ref 13.0–17.0)
MCH: 30 pg (ref 26.0–34.0)
MCHC: 35.2 g/dL (ref 30.0–36.0)
MCV: 85.1 fL (ref 78.0–100.0)
Platelets: 207 10*3/uL (ref 150–400)
RBC: 4.97 MIL/uL (ref 4.22–5.81)
RDW: 15 % (ref 11.5–15.5)
WBC: 7.7 10*3/uL (ref 4.0–10.5)

## 2015-09-22 LAB — BASIC METABOLIC PANEL
Anion gap: 10 (ref 5–15)
BUN: 15 mg/dL (ref 6–20)
CHLORIDE: 103 mmol/L (ref 101–111)
CO2: 26 mmol/L (ref 22–32)
CREATININE: 1.02 mg/dL (ref 0.61–1.24)
Calcium: 8.6 mg/dL — ABNORMAL LOW (ref 8.9–10.3)
GFR calc Af Amer: >60 mL/min (ref >60)
GFR calc non Af Amer: >60 mL/min (ref >60)
Glucose, Bld: 110 mg/dL — ABNORMAL HIGH (ref 65–99)
Potassium: 3.8 mmol/L (ref 3.5–5.1)
Sodium: 139 mmol/L (ref 135–145)

## 2015-09-22 LAB — HEPARIN LEVEL (UNFRACTIONATED): Heparin Unfractionated: 0.34 IU/mL (ref 0.30–0.70)

## 2015-09-22 SURGERY — ECHOCARDIOGRAM, TRANSESOPHAGEAL
Anesthesia: General

## 2015-09-22 MED ORDER — LIDOCAINE HCL (CARDIAC) 20 MG/ML IV SOLN
INTRAVENOUS | Status: DC | PRN
  Administered 2015-09-22: 80 mg via INTRAVENOUS

## 2015-09-22 MED ORDER — FENTANYL CITRATE (PF) 100 MCG/2ML IJ SOLN: 25.0000 ug | INTRAMUSCULAR | Status: DC | PRN

## 2015-09-22 MED ORDER — ONDANSETRON HCL 4 MG/2ML IJ SOLN: 4.0000 mg | Freq: Once | INTRAMUSCULAR | Status: DC | PRN

## 2015-09-22 MED ORDER — FENTANYL CITRATE (PF) 250 MCG/5ML IJ SOLN
INTRAMUSCULAR | Status: DC | PRN
  Administered 2015-09-22: 50 ug via INTRAVENOUS
  Administered 2015-09-22: 100 ug via INTRAVENOUS

## 2015-09-22 MED ORDER — LACTATED RINGERS IV SOLN
INTRAVENOUS | Status: DC | PRN
  Administered 2015-09-22: 14:00:00 via INTRAVENOUS

## 2015-09-22 MED ORDER — SUCCINYLCHOLINE CHLORIDE 20 MG/ML IJ SOLN
INTRAMUSCULAR | Status: DC | PRN
  Administered 2015-09-22: 120 mg via INTRAVENOUS

## 2015-09-22 MED ORDER — SODIUM CHLORIDE 0.9 % IV SOLN: INTRAVENOUS | Status: DC

## 2015-09-22 MED ORDER — OXYCODONE HCL 5 MG/5ML PO SOLN: 5.0000 mg | Freq: Once | ORAL | Status: DC | PRN

## 2015-09-22 MED ORDER — CARVEDILOL 12.5 MG PO TABS
12.5000 mg | ORAL_TABLET | Freq: Two times a day (BID) | ORAL | Status: DC
Start: 2015-09-22 — End: 2015-09-23
  Administered 2015-09-22 – 2015-09-23 (×2): 12.5 mg via ORAL
  Filled 2015-09-22 (×2): qty 1

## 2015-09-22 MED ORDER — WARFARIN - PHYSICIAN DOSING INPATIENT: Freq: Every day | Status: DC

## 2015-09-22 MED ORDER — FENTANYL CITRATE (PF) 250 MCG/5ML IJ SOLN
INTRAMUSCULAR | Status: AC
Start: 2015-09-22 — End: 2015-09-22
  Filled 2015-09-22: qty 5

## 2015-09-22 MED ORDER — OXYCODONE HCL 5 MG PO TABS: 5.0000 mg | ORAL_TABLET | Freq: Once | ORAL | Status: DC | PRN

## 2015-09-22 MED ORDER — ENALAPRIL MALEATE 5 MG PO TABS
5.0000 mg | ORAL_TABLET | Freq: Two times a day (BID) | ORAL | Status: DC
  Administered 2015-09-22 (×2): 5 mg via ORAL
  Filled 2015-09-22 (×7): qty 1

## 2015-09-22 MED ORDER — PROPOFOL 10 MG/ML IV BOLUS
INTRAVENOUS | Status: DC | PRN
  Administered 2015-09-22: 200 mg via INTRAVENOUS

## 2015-09-22 MED ORDER — FUROSEMIDE 20 MG PO TABS
20.0000 mg | ORAL_TABLET | Freq: Every day | ORAL | Status: DC
  Administered 2015-09-22 – 2015-09-23 (×2): 20 mg via ORAL
  Filled 2015-09-22 (×2): qty 1

## 2015-09-22 MED ORDER — PHENYLEPHRINE HCL 10 MG/ML IJ SOLN
INTRAMUSCULAR | Status: DC | PRN
  Administered 2015-09-22: 80 ug via INTRAVENOUS

## 2015-09-22 MED ORDER — MIDAZOLAM HCL 2 MG/2ML IJ SOLN
INTRAMUSCULAR | Status: AC
  Filled 2015-09-22: qty 2

## 2015-09-22 MED ORDER — AMIODARONE HCL 200 MG PO TABS
400.0000 mg | ORAL_TABLET | Freq: Two times a day (BID) | ORAL | Status: DC
  Administered 2015-09-22 – 2015-09-23 (×2): 400 mg via ORAL
  Filled 2015-09-22 (×2): qty 2

## 2015-09-22 MED ORDER — DEXAMETHASONE SODIUM PHOSPHATE 4 MG/ML IJ SOLN
INTRAMUSCULAR | Status: DC | PRN
  Administered 2015-09-22: 4 mg via INTRAVENOUS

## 2015-09-22 MED ORDER — WARFARIN SODIUM 7.5 MG PO TABS
7.5000 mg | ORAL_TABLET | Freq: Once | ORAL | Status: AC
  Administered 2015-09-22: 7.5 mg via ORAL
  Filled 2015-09-22: qty 1

## 2015-09-22 MED ORDER — MIDAZOLAM HCL 2 MG/2ML IJ SOLN
INTRAMUSCULAR | Status: DC | PRN
  Administered 2015-09-22: 2 mg via INTRAVENOUS

## 2015-09-22 NOTE — Transfer of Care (Signed)
Immediate Anesthesia Transfer of Care Note  Patient: Luke Butler  Procedure(s) Performed: Procedure(s): TRANSESOPHAGEAL ECHOCARDIOGRAM (TEE) (N/A) CARDIOVERSION (N/A)  Patient Location: Endoscopy Unit  Anesthesia Type:General  Level of Consciousness: awake and alert   Airway & Oxygen Therapy: Patient Spontanous Breathing and Patient connected to face mask oxygen  Post-op Assessment: Report given to RN and Post -op Vital signs reviewed and stable  Post vital signs: Reviewed and stable  Last Vitals:  Filed Vitals:   09/22/15 1425  BP: 158/116  Pulse: 84  Temp:   Resp: 22    Complications: No apparent anesthesia complications

## 2015-09-22 NOTE — H&P (Signed)
     INTERVAL PROCEDURE H&P  History and Physical Interval Note:  09/22/2015 1:41 PM  Luke Butler has presented today for their planned procedure. The various methods of treatment have been discussed with the patient and family. After consideration of risks, benefits and other options for treatment, the patient has consented to the procedure.  The patients' outpatient history has been reviewed, patient examined, and no change in status from most recent office note within the past 30 days. I have reviewed the patients' chart and labs and will proceed as planned. Questions were answered to the patient's satisfaction.   Chrystie Nose, MD, [facility] Attending Cardiologist [facility]  Chrystie Nose 09/22/2015, 1:41 PM

## 2015-09-22 NOTE — Progress Notes (Signed)
ANTICOAGULATION CONSULT NOTE  Pharmacy Consult for heparin Indication: atrial fibrillation  No Known Allergies  Patient Measurements: Height: 5\' 11"  (180.3 cm) Weight: (!) 319 lb 14.2 oz (145.1 kg) IBW/kg (Calculated) : 75.3 Heparin Dosing Weight: 106 kg  Vital Signs: Temp: 97.7 F (36.5 C) (11/16 0802) Temp Source: Oral (11/16 0802) BP: 141/110 mmHg (11/16 0802) Pulse Rate: 92 (11/16 0802)  Labs:  Recent Labs  09/20/15 1015 09/20/15 1818  09/20/15 2229 09/21/15 0755 09/21/15 0848 09/21/15 1610 09/22/15 0230  HGB 16.7  --   --   --  15.3  --   --  14.9  HCT 47.0  --   --   --  44.3  --   --  42.3  PLT 228  --   --   --  198  --   --  207  LABPROT  --  16.3*  --   --   --   --   --   --   INR  --  1.30  --   --   --   --   --   --   HEPARINUNFRC  --   --   < > <0.10*  --  0.14* 0.15* 0.34  CREATININE 0.98  --   --   --  0.91  --   --  1.02  TROPONINI  --  0.03  --  0.03 <0.03  --   --   --   < > = values in this interval not displayed.  Estimated Creatinine Clearance: 126.5 mL/min (by C-G formula based on Cr of 1.02).  Assessment: 49 y.o. male with Afib currently on heparin drip 2600 uts/hr HL now at goal 0.34.  CBC stable no bleeding noted.   Plan for cardioversion at 1pm. Likely to transition to DOAC at that time. Currently afib in 80s  Goal of Therapy:  Heparin level 0.3-0.7 units/ml Monitor platelets by anticoagulation protocol: Yes   Plan:  Continue heparin at 2600 units/hr Check daily HL, CBC Follow up after DCCV this afternoon   Sheppard Coil PharmD., BCPS Clinical Pharmacist Pager [phone] 09/22/2015 9:21 AM

## 2015-09-22 NOTE — CV Procedure (Signed)
TEE/CARDIOVERSION NOTE  TRANSESOPHAGEAL ECHOCARDIOGRAM (TEE):  Indictation: Atrial Fibrillation  Consent:   Informed consent was obtained prior to the procedure. The risks, benefits and alternatives for the procedure were discussed and the patient comprehended these risks.  Risks include, but are not limited to, cough, sore throat, vomiting, nausea, somnolence, esophageal and stomach trauma or perforation, bleeding, low blood pressure, aspiration, pneumonia, infection, trauma to the teeth and death.    Time Out: Verified patient identification, verified procedure, site/side was marked, verified correct patient position, special equipment/implants available, medications/allergies/relevent history reviewed, required imaging and test results available. Performed  Procedure:  After a procedural time-out, the patient was under general anesthesia with intubation per anesthesia.  The oropharynx was anesthetized 2 topical cetacaine sprays.  The transesophageal probe was inserted in the esophagus and stomach without difficulty and multiple views were obtained.  The patient was kept under observation until the patient left the procedure room.  The patient left the procedure room in stable condition.   Agitated microbubble saline contrast was administered.  Complications:    Complications: None Patient did tolerate procedure well.  Findings:  1. LEFT VENTRICLE: The left ventricular wall thickness is severely increased.  The left ventricular cavity is dilated in size. Wall motion is globally hypokinetic.  LVEF is 35%.  2. RIGHT VENTRICLE:  The right ventricle is normal in structure and function without any thrombus or masses.    3. LEFT ATRIUM:  The left atrium is dilated in size without any thrombus or masses.  There is not spontaneous echo contrast ("smoke") in the left atrium consistent with a low flow state.  4. LEFT ATRIAL APPENDAGE:  The left atrial appendage is free of any thrombus  or masses. The appendage has single lobes, is quite deep with a brocolli morphology. Pulse doppler indicates low flow in the appendage.  5. ATRIAL SEPTUM:  The atrial septum is aneurysmal.  There is a small amount of right to left interatrial shunting by color doppler and saline microbubble, suggestive of PFO.  6. RIGHT ATRIUM:  The right atrium is normal in size and function without any thrombus or masses.  7. MITRAL VALVE:  The mitral valve is normal in structure and function with Mild functional regurgitation. The mitral annulus is dilated. There were no vegetations or stenosis.  8. AORTIC VALVE:  The aortic valve is trileaflet, normal in structure and function with no regurgitation.  There were no vegetations or stenosis  9. TRICUSPID VALVE:  The tricuspid valve is normal in structure and function with Mild regurgitation.  There were no vegetations or stenosis  10.  PULMONIC VALVE:  The pulmonic valve is normal in structure and function with no regurgitation.  There were no vegetations or stenosis.   11. AORTIC ARCH, ASCENDING AND DESCENDING AORTA:  There was no Myrtis Ser et. Al, 1992) atherosclerosis of the ascending aorta, aortic arch, or proximal descending aorta.  12. PULMONARY VEINS: Anomalous pulmonary venous return was not noted.  13. PERICARDIUM: The pericardium appeared normal and non-thickened.  There is no pericardial effusion.  CARDIOVERSION:     Second Time Out: Verified patient identification, verified procedure, site/side was marked, verified correct patient position, special equipment/implants available, medications/allergies/relevent history reviewed, required imaging and test results available.  Performed  Procedure:  1. Patient placed on cardiac monitor, pulse oximetry, supplemental oxygen as necessary.  2. Sedation administered per anesthesia 3. Pacer pads placed anterior and posterior chest. 4. Cardioverted 1 time(s).  5. Cardioverted at 150J  biphasic.  Complications:  Complications: None Patient did tolerate procedure well.  Impression:  1. No LAA thrombus, dilated LA 2. Interatrial septal aneurysm with small PFO by color doppler and saline microbubble contrast 3. Mild functional MR 4. Severe LVH 5. Global hypokinesis with EF 35% 6. Successful DCCV with a single 150J biphasic shock  Recommendations:  1. Continue IV heparin and transition to DOAC until at steady state.  2. May need antiarrythmic therapy given recurrence and the fact that he was previously on Tikosyn, defer to EP recommendations.  Time Spent Directly with the Patient:  45 minutes   Chrystie Nose, MD, [facility] Attending Cardiologist [facility]  09/22/2015, 2:07 PM

## 2015-09-22 NOTE — Anesthesia Postprocedure Evaluation (Signed)
  Anesthesia Post-op Note  Patient: Luke Butler  Procedure(s) Performed: Procedure(s): TRANSESOPHAGEAL ECHOCARDIOGRAM (TEE) (N/A) CARDIOVERSION (N/A)  Patient Location: Endoscopy Unit  Anesthesia Type:General  Level of Consciousness: awake, alert  and oriented  Airway and Oxygen Therapy: Patient Spontanous Breathing  Post-op Pain: none  Post-op Assessment: Post-op Vital signs reviewed, Patient's Cardiovascular Status Stable, Respiratory Function Stable, Patent Airway and Pain level controlled              Post-op Vital Signs: stable  Last Vitals:  Filed Vitals:   09/22/15 1450  BP: 154/91  Pulse: 80  Temp:   Resp: 13    Complications: No apparent anesthesia complications

## 2015-09-22 NOTE — Anesthesia Preprocedure Evaluation (Addendum)
Anesthesia Evaluation  Patient identified by MRN, date of birth, ID band Patient awake    Reviewed: Allergy & Precautions, NPO status , Patient's Chart, lab work & pertinent test results  Airway Mallampati: II  TM Distance: >3 FB Neck ROM: Full    Dental  (+) Teeth Intact, Dental Advisory Given   Pulmonary    breath sounds clear to auscultation       Cardiovascular hypertension,  Rhythm:Irregular Rate:Normal     Neuro/Psych    GI/Hepatic   Endo/Other    Renal/GU      Musculoskeletal   Abdominal (+) + obese,   Peds  Hematology   Anesthesia Other Findings   Reproductive/Obstetrics                            Anesthesia Physical Anesthesia Plan  ASA: III  Anesthesia Plan: General   Post-op Pain Management:    Induction: Intravenous  Airway Management Planned: Oral ETT  Additional Equipment:   Intra-op Plan:   Post-operative Plan: Extubation in OR  Informed Consent: I have reviewed the patients History and Physical, chart, labs and discussed the procedure including the risks, benefits and alternatives for the proposed anesthesia with the patient or authorized representative who has indicated his/her understanding and acceptance.   Dental advisory given  Plan Discussed with: CRNA and Anesthesiologist  Anesthesia Plan Comments:         Anesthesia Quick Evaluation  

## 2015-09-22 NOTE — Anesthesia Procedure Notes (Signed)
Procedure Name: Intubation Date/Time: 09/22/2015 1:49 PM Performed by: Reine Just Pre-anesthesia Checklist: Emergency Drugs available, Patient identified, Timeout performed, Suction available and Patient being monitored Patient Re-evaluated:Patient Re-evaluated prior to inductionOxygen Delivery Method: Circle system utilized Preoxygenation: Pre-oxygenation with 100% oxygen Intubation Type: IV induction Ventilation: Mask ventilation without difficulty and Oral airway inserted - appropriate to patient size Laryngoscope Size: Miller, 3 and Glidescope Grade View: Grade III Tube type: Oral Tube size: 7.5 mm Number of attempts: 2 Airway Equipment and Method: Video-laryngoscopy Placement Confirmation: breath sounds checked- equal and bilateral and positive ETCO2 Tube secured with: Tape Dental Injury: Teeth and Oropharynx as per pre-operative assessment  Difficulty Due To: Difficulty was anticipated Future Recommendations: Recommend- induction with short-acting agent, and alternative techniques readily available

## 2015-09-22 NOTE — Progress Notes (Signed)
Echocardiogram Echocardiogram Transesophageal has been performed.  Dorothey Baseman 09/22/2015, 3:22 PM

## 2015-09-22 NOTE — Consult Note (Addendum)
ELECTROPHYSIOLOGY CONSULT NOTE    Patient ID: Luke Butler MRN: [id number], DOB/AGE: Jul 24, 1966 49 y.o.  Admit date: 09/20/2015 Date of Consult: 09/22/2015   Primary Physician: No PCP Per Patient Primary Cardiologist: new, Dr. Eldridge Dace, patient lives currently primarily in [place], has a home in [place], but has been working in [place]  Reason for Consultation: Atrial fibrillation  HPI: Luke Butler is a 49 y.o. male who was admitted 09/20/15 to [facility] with increasing SOB, symptoms oforthopnea, and a heaviness/gas feeling in his chest.  These he states were similar to his prior symptoms with Afib last year.  He feels "100% better" this morning, slept very well last night. He was admitted with Afib, RVR, and CHF, treated with Cardizem gtt and IV lopressor.  He remains in AFib, currently low 100's and is pending DCCV this afternoon.   His record reveals: In October 2015, he was admitted for similar symptoms at [facility] in [place]. He was diagnosed with AF RVR in the setting of excess diet Coke - he had consumed more than usual as he'd taken 4 planes in 2 days for work meetings. UDS was also positive for cocaine during that admission per [facility]. He underwent TEE / DCCV the morning after admission 08/21/2014 with EF 30-35% / LV- normal size with moderate lobar hypokinesis / Mod MR / successful DCCV to normal sinus rhythm. He says he did not go back into atrial fib after the DCCV so it's unclear why Tikosyn specifically was chosen other than low EF. He recalls having a chemical test that did not show any blockages - hospital notes indicate CTA that ruled out PE, but plans for ischemic eval as outpatient. He was discharged with carvedilol, Tikosyn BID, enalapril, Lasix 20mg  daily, and Xarelto. He thought he had an episode of AF in January, but when he went to the ER to get checked out in [place], everything apparently looked OK.    The patient tells me that he was able to  maintain the medicines for about 654mo, lost his insurance with some employment changes and about 654mo ago was unable to continue them, staying on ASA therapy alone for his AF  He remains without insurance, though states he expects to have coverage with his current employer early next year.  He does state given he travels for his work, he found it difficult to keep the Tikosyn refilled and taken at the dosed times.  He denies any ongoing drug use, reports he has not used cocaine since 2015, he drinks about 3 glasses of wine weekend evenings, he has never smoked.    Past Medical History  Diagnosis Date  . Essential hypertension   . Paroxysmal atrial fibrillation (HCC)     a. Dx in [place] in 2015, in setting of excess Diet Coke. s/p TEE/DCCV and then Tikosyn/Xarelto.  . Morbid obesity (HCC)   . Chronic systolic CHF (congestive heart failure) (HCC)     a. [place] 08/21/2014: TEE / DCCV- EF: 30-35% / LV- normal size with moderate lobar hypokinesis / Mod MR / Successful DCCV to normal sinus rhythm.  . History of cocaine use      Surgical History:  Past Surgical History  Procedure Laterality Date  . Carpal tunnel release       Prescriptions prior to admission  Medication Sig Dispense Refill Last Dose  . aspirin EC 81 MG tablet Take 162 mg by mouth 2 (two) times daily.   09/20/2015 at Unknown time  . carvedilol (COREG)  12.5 MG tablet Take 12.5 mg by mouth 2 (two) times daily.   6 months  . dofetilide (TIKOSYN) 250 MCG capsule Take 250 mcg by mouth every 12 (twelve) hours.   6 months  . enalapril (VASOTEC) 5 MG tablet Take 5 mg by mouth 2 (two) times daily.   6 months  . furosemide (LASIX) 20 MG tablet Take 20 mg by mouth daily.   6 months  . rivaroxaban (XARELTO) 20 MG TABS tablet Take 20 mg by mouth daily with supper.   6 months    Inpatient Medications:  . carvedilol  12.5 mg Oral BID WC  . diltiazem  90 mg Oral QID  . enalapril  5 mg Oral BID  . furosemide  20 mg Oral Daily  . heparin   2,000 Units Intravenous Once  . sodium chloride  3 mL Intravenous Q12H  . sodium chloride  3 mL Intravenous Q12H    Allergies: No Known Allergies  Social History   Social History  . Marital Status: Single    Spouse Name: N/A  . Number of Children: N/A  . Years of Education: N/A   Occupational History  .      Travel Transport planner   Social History Main Topics  . Smoking status: Never Smoker   . Smokeless tobacco: Never Used  . Alcohol Use: 4.2 oz/week    7 Glasses of wine per week     Comment: Drinks Fri/Sat - 3-4 glasses of wine at a time  . Drug Use: Yes     Comment: H/o prior cocaine use  . Sexual Activity: Not on file   Other Topics Concern  . Not on file   Social History Narrative     Family History  Problem Relation Age of Onset  . Heart disease Neg Hx      Review of Systems: All other systems reviewed and are otherwise negative except as noted above.  Physical Exam: Filed Vitals:   09/21/15 2023 09/21/15 2300 09/22/15 0330 09/22/15 0802  BP: 95/66 116/93 114/78 141/110  Pulse:    92  Temp: 97.9 F (36.6 C) 98.1 F (36.7 C) 97.8 F (36.6 C) 97.7 F (36.5 C)  TempSrc: Oral Oral Oral Oral  Resp: Height:      Weight:   319 lb 14.2 oz (145.1 kg)   SpO2: 95% 97% 96% 94%    GEN- The patient is morbidly obese, appearing, alert and oriented x 3 today.   HEENT: normocephalic, atraumatic; sclera clear, conjunctiva pink; hearing intact; oropharynx clear; neck supple, no JVP Lymph- no cervical lymphadenopathy Lungs- Clear to ausculation bilaterally, normal work of breathing.  No wheezes, rales, rhonchi Heart- irregular rate and  tachycardic, no murmurs, rubs or gallops GI- soft, non-tender, non-distended, bowel sounds present Extremities- no clubbing, cyanosis, + trace edema b/l LE MS- no significant deformity or atrophy Skin- warm and dry, no rash or lesion Psych- euthymic mood, full affect Neuro- no gross deficits observed  Labs:   Lab  Results  Component Value Date   WBC 7.7 09/22/2015   HGB 14.9 09/22/2015   HCT 42.3 09/22/2015   MCV 85.1 09/22/2015   PLT 207 09/22/2015    Recent Labs Lab 09/20/15 1818  09/22/15 0230  NA  --   < > 139  K  --   < > 3.8  CL  --   < > 103  CO2  --   < > 26  BUN  --   < >  15  CREATININE  --   < > 1.02  CALCIUM  --   < > 8.6*  PROT 6.1*  --   --   BILITOT 1.7*  --   --   ALKPHOS 42  --   --   ALT 74*  --   --   AST 37  --   --   GLUCOSE  --   < > 110*  < > = values in this interval not displayed.    Radiology/Studies:  Dg Chest Port 1 View 09/20/2015  CLINICAL DATA:  Short of breath.  Atrial fibrillation. EXAM: PORTABLE CHEST 1 VIEW COMPARISON:  None. FINDINGS: Cardiac silhouette borderline enlarged. No mediastinal or hilar masses or evidence of adenopathy. There is central and lung base interstitial prominence accentuated by low lung volumes. There is additional linear opacity at the right lung base that is likely due to atelectasis. No convincing pneumonia. No pleural effusion or pneumothorax. Bony thorax is demineralized but grossly intact. IMPRESSION: 1. Cardiac silhouette borderline enlarged. Central and lower lung zone interstitial prominence. Mild congestive heart failure suspected. No evidence of pneumonia. Electronically Signed   By: Amie Portland M.D.   On: 09/20/2015 11:28   09/21/15: Echocardiogram Study Conclusions - Left ventricle: The cavity size was normal. There was severe concentric hypertrophy. Systolic function was moderately to severely reduced. The estimated ejection fraction was in the range of 30% to 35%. Diffuse hypokinesis. - Mitral valve: There was trivial regurgitation. - Left atrium: The atrium was severely dilated. - LVEF may be underestimated due to atrial fibrillation. LA ID, A-P, ES              52  mm     EKG:  09/21/15: Atrial Fib, 94bpm, QTc 09/20/15: AFib, 139bpm, QS V1-2, lat ST/T changes  TELEMETRY: afib,  low 100's n, some rats into the 60's observed  Assessment and Plan:  1. Paroxysmal AFib     He is planned for DCCV this afternoon     The patient is morbidly obese, has financial constraints and difficulty being compliant with medicines and hx of cocaine use, though denies any in a year (tox negative this admission)      At this time his only AAD option is amiodarone, would start post DCCV if SR is achieved, if not load with amiodarone and consider re-try CV in the future once loaded for a rhythm control option, otherwise continue rate control efforts.     CHADS2vasc = 2 currently on heparin gtt, planned for DOAC if able to arrange financially  2. HTN 3. systolic dysfunction, CHF     No known ischemic evaluation historically     He is feeling much better, no rest SOB 4. Morbid obesity     The patient expressed his desire to start exercising and working on his health 5. Mild abnormal Troponin     Deferred to primary cardiology     No ongoing c/o CP      Continue care with primary cardiology service  Signed, Francis Dowse, PA-C 09/22/2015 11:32 AM  EP Attending  Patient seen and examined. Agree with the findings as noted above with minimal modification. Exam reveals a RRR and lungs are clear. The ext. Demonstrate trace edema. He has been cardioverted back to NSR. His QT interval is prolonged. I have discussed the treatment options with the patient. I have recommended he start amio as his non-compliance makes Tikosyn less than optimal, especially with QT prolongation. If down the  road his ECG demonstrates a reduction of the QT, could consider alternative AA drugs, especially if he can maintain his compliance.  Sinan Tuch,M.D.       EP Addendum  The patient has reverted back to atrial fib. He will need rate control, initiation of amiodarone and consider repeat DCCV in 3-4 weeks. His financial status makes him essentially require generic medications.   Leonia Reeves.D.

## 2015-09-22 NOTE — Progress Notes (Addendum)
Patient Name: Luke Butler Date of Encounter: 09/22/2015     Principal Problem:   Atrial fibrillation with RVR (HCC) Active Problems:   Essential hypertension   Morbid obesity (HCC)   Acute on chronic systolic (congestive) heart failure (HCC)   Atrial fibrillation (HCC)    SUBJECTIVE  Feeling much better- awaiting DCCV.   CURRENT MEDS . carvedilol  6.25 mg Oral BID WC  . diltiazem  90 mg Oral QID  . heparin  2,000 Units Intravenous Once  . sodium chloride  3 mL Intravenous Q12H  . sodium chloride  3 mL Intravenous Q12H    OBJECTIVE  Filed Vitals:   09/21/15 1700 09/21/15 2023 09/21/15 2300 09/22/15 0330  BP: 134/99 95/66 116/93 114/78  Pulse:      Temp: 98.1 F (36.7 C) 97.9 F (36.6 C) 98.1 F (36.7 C) 97.8 F (36.6 C)  TempSrc: Oral Oral Oral Oral  Resp: 18 18 16 18   Height:      Weight:    319 lb 14.2 oz (145.1 kg)  SpO2: 97% 95% 97% 96%    Intake/Output Summary (Last 24 hours) at 09/22/15 0758 Last data filed at 09/22/15 0600  Gross per 24 hour  Intake 1475.25 ml  Output   1900 ml  Net -424.75 ml   Filed Weights   09/20/15 1700 09/21/15 0329 09/22/15 0330  Weight: 323 lb 3.1 oz (146.6 kg) 322 lb 1.5 oz (146.1 kg) 319 lb 14.2 oz (145.1 kg)    PHYSICAL EXAM  General: Pleasant, NAD. obese Neuro: Alert and oriented X 3. Moves all extremities spontaneously. Psych: Normal affect. HEENT:  Normal  Neck: Supple without bruits or JVD. Lungs:  Resp regular and unlabored, CTA. Heart: irreg irreg no s3, s4, or murmurs. Abdomen: Soft, non-tender, non-distended, BS + x 4.  Extremities: No clubbing, cyanosis or edema. DP/PT/Radials 2+ and equal bilaterally.  Accessory Clinical Findings  CBC  Recent Labs  09/21/15 0755 09/22/15 0230  WBC 7.4 7.7  HGB 15.3 14.9  HCT 44.3 42.3  MCV 85.2 85.1  PLT 198 207   Basic Metabolic Panel  Recent Labs  09/20/15 1818 09/21/15 0755 09/22/15 0230  NA  --  138 139  K  --  4.3 3.8  CL  --  103 103    CO2  --  25 26  GLUCOSE  --  95 110*  BUN  --  11 15  CREATININE  --  0.91 1.02  CALCIUM  --  8.7* 8.6*  MG 1.8  --   --    Liver Function Tests  Recent Labs  09/20/15 1818  AST 37  ALT 74*  ALKPHOS 42  BILITOT 1.7*  PROT 6.1*  ALBUMIN 3.6    Cardiac Enzymes  Recent Labs  09/20/15 1818 09/20/15 2229 09/21/15 0755  TROPONINI 0.03 0.03 <0.03    Hemoglobin A1C  Recent Labs  09/20/15 1818  HGBA1C 5.0   Fasting Lipid Panel  Recent Labs  09/21/15 0735  CHOL 131  HDL 31*  LDLCALC 85  TRIG 76  CHOLHDL 4.2   Thyroid Function Tests  Recent Labs  09/20/15 1818  TSH 1.304    TELE  afib with some PVCs. HR currently 100-115 bpm  Radiology/Studies  Dg Chest Port 1 View  09/20/2015  CLINICAL DATA:  Short of breath.  Atrial fibrillation. EXAM: PORTABLE CHEST 1 VIEW COMPARISON:  None. FINDINGS: Cardiac silhouette borderline enlarged. No mediastinal or hilar masses or evidence of adenopathy. There is central and  lung base interstitial prominence accentuated by low lung volumes. There is additional linear opacity at the right lung base that is likely due to atelectasis. No convincing pneumonia. No pleural effusion or pneumothorax. Bony thorax is demineralized but grossly intact. IMPRESSION: 1. Cardiac silhouette borderline enlarged. Central and lower lung zone interstitial prominence. Mild congestive heart failure suspected. No evidence of pneumonia. Electronically Signed   By: Amie Portland M.D.   On: 09/20/2015 11:28    Study Date: 09/21/2015 LV EF: 30% -  35% Study Conclusions - Left ventricle: The cavity size was normal. There was severe concentric hypertrophy. Systolic function was moderately to severely reduced. The estimated ejection fraction was in the range of 30% to 35%. Diffuse hypokinesis. - Mitral valve: There was trivial regurgitation. - Left atrium: The atrium was severely dilated. - LVEF may be underestimated due to atrial  fibrillation.    ASSESSMENT AND PLAN Luke Butler is a 49 y/o M with history of essential HTN, atrial fib, morbid obesity, prior cocaine abuse, chronic systolic CHF (EF 16-10% in 08/2014) who presented to [facility] with atrial fib RVR and acute on chronic systolic CHF. 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 He has not been taking any of his meds for >6 months.  1) AFib: much better rate controlled on increased Coreg to 6.25 BID and oral dilt  q 6 hrs. Consider switching to Cardizem CD . Hr currently 100-115 bpm -- TSH normal but T4 slightly elevated.  --This patients CHA2DS2-VASc Score and unadjusted Ischemic Stroke Rate (% per year) is equal to 2.2 % stroke rate/year from a score of 2 --  Plan for TEE/CV today at 1pm. We stressed the importance of longterm anticoagulation. Case manager is working on getting him free NOAC. Continue IV heparin for now. NOAC to start post cardioversion (previously on Xarelto).  2) Acute on chronic systolic CHF. Prior low EF at [facility] in 2015. Repeat 2D ECHO yesterday with EF 30-35%, diffuse HK. Severe LA dilation.  -- Given IV Lasix 20 x1 and IV Lasix  x1.  Net neg 1.4L. Weight down 4 lbs (323--> 319lbs). Appears euvolemic and breathing back to baseline. Will start Lasix  po qd. Creat stable.  -- Continue coreg 6.25mg  BID and will resume home Enalapril  BID (his BP is 140/110)  3) Obesity -- Longterm, he needs weight loss.   4) Prior cocaine use- UDS negative this admission   5) HTN- continue Coreg 6.25mg  BID and Enalapril  BID for treatment of his cardiomyopathy.   Above score calculated as 1 point each if present [CHF, HTN, DM, Vascular=MI/PAD/Aortic Plaque, Age if 65-74, or Male] Above score calculated as 2 points each if present [Age > 75, or Stroke/TIA/TE]   Signed, Luke Hora PA-C  Pager [phone]  I have examined the patient and  reviewed assessment and plan and discussed with patient.  Agree with above as stated.  Rate controlled while asleep.  HR picks up while awake.  Plan for cardioversion today.  Limited options for antiarrhythmic due to low EF.  Cost and availability were issues with Tikosyn in the past.  Young age makes long term Amiodarone less than ideal.  Will see if EP has other ideas as I am not sure he will maintain NSR without antiarrhythmic given large left atrial size.  Increase Carvedilol.  Appears euvolemic.  Hold Lasix today given the cardioversion.  VARANASI,JAYADEEP S.

## 2015-09-23 ENCOUNTER — Encounter (HOSPITAL_COMMUNITY): Payer: Self-pay | Admitting: Internal Medicine

## 2015-09-23 DIAGNOSIS — E669 Obesity, unspecified: Secondary | ICD-10-CM | POA: Diagnosis present

## 2015-09-23 DIAGNOSIS — Z9119 Patient's noncompliance with other medical treatment and regimen: Secondary | ICD-10-CM

## 2015-09-23 DIAGNOSIS — Z91199 Patient's noncompliance with other medical treatment and regimen due to unspecified reason: Secondary | ICD-10-CM

## 2015-09-23 LAB — HEPARIN LEVEL (UNFRACTIONATED): HEPARIN UNFRACTIONATED: 0.87 [IU]/mL — AB (ref 0.30–0.70)

## 2015-09-23 LAB — CBC
HEMATOCRIT: 43.4 % (ref 39.0–52.0)
Hemoglobin: 15.1 g/dL (ref 13.0–17.0)
MCH: 29.7 pg (ref 26.0–34.0)
MCHC: 34.8 g/dL (ref 30.0–36.0)
MCV: 85.4 fL (ref 78.0–100.0)
PLATELETS: 237 10*3/uL (ref 150–400)
RBC: 5.08 MIL/uL (ref 4.22–5.81)
RDW: 15.1 % (ref 11.5–15.5)
WBC: 7.8 10*3/uL (ref 4.0–10.5)

## 2015-09-23 LAB — PROTIME-INR
INR: 1.24 (ref 0.00–1.49)
Prothrombin Time: 15.8 seconds — ABNORMAL HIGH (ref 11.6–15.2)

## 2015-09-23 LAB — BASIC METABOLIC PANEL
ANION GAP: 10 (ref 5–15)
BUN: 16 mg/dL (ref 6–20)
CHLORIDE: 103 mmol/L (ref 101–111)
CO2: 27 mmol/L (ref 22–32)
Calcium: 9.2 mg/dL (ref 8.9–10.3)
Creatinine, Ser: 1.1 mg/dL (ref 0.61–1.24)
GLUCOSE: 154 mg/dL — AB (ref 65–99)
POTASSIUM: 4.3 mmol/L (ref 3.5–5.1)
SODIUM: 140 mmol/L (ref 135–145)

## 2015-09-23 MED ORDER — AMIODARONE HCL 400 MG PO TABS: 400.0000 mg | ORAL_TABLET | Freq: Two times a day (BID) | ORAL | Status: DC

## 2015-09-23 MED ORDER — POTASSIUM CHLORIDE CRYS ER 20 MEQ PO TBCR: 20.0000 meq | EXTENDED_RELEASE_TABLET | Freq: Every day | ORAL | Status: DC

## 2015-09-23 MED ORDER — FUROSEMIDE 20 MG PO TABS: 20.0000 mg | ORAL_TABLET | Freq: Every day | ORAL | Status: DC

## 2015-09-23 MED ORDER — FUROSEMIDE 40 MG PO TABS: 40.0000 mg | ORAL_TABLET | Freq: Every day | ORAL | Status: DC

## 2015-09-23 MED ORDER — ENALAPRIL MALEATE 5 MG PO TABS: 5.0000 mg | ORAL_TABLET | Freq: Two times a day (BID) | ORAL | Status: DC

## 2015-09-23 MED ORDER — CARVEDILOL 25 MG PO TABS: 25.0000 mg | ORAL_TABLET | Freq: Two times a day (BID) | ORAL | Status: DC

## 2015-09-23 MED ORDER — DILTIAZEM HCL ER COATED BEADS 240 MG PO CP24: 240.0000 mg | ORAL_CAPSULE | Freq: Every day | ORAL | Status: DC

## 2015-09-23 MED ORDER — APIXABAN 5 MG PO TABS: 5.0000 mg | ORAL_TABLET | Freq: Two times a day (BID) | ORAL | Status: DC

## 2015-09-23 MED ORDER — DILTIAZEM HCL ER COATED BEADS 240 MG PO CP24
240.0000 mg | ORAL_CAPSULE | Freq: Every day | ORAL | Status: DC
  Administered 2015-09-23: 240 mg via ORAL
  Filled 2015-09-23: qty 1

## 2015-09-23 MED ORDER — APIXABAN 5 MG PO TABS
5.0000 mg | ORAL_TABLET | Freq: Two times a day (BID) | ORAL | Status: DC
  Administered 2015-09-23: 5 mg via ORAL
  Filled 2015-09-23: qty 1

## 2015-09-23 MED ORDER — POTASSIUM CHLORIDE ER 20 MEQ PO TBCR: 20.0000 meq | EXTENDED_RELEASE_TABLET | Freq: Every day | ORAL | Status: DC

## 2015-09-23 NOTE — Progress Notes (Addendum)
Patient Name: Luke Butler Date of Encounter: 09/23/2015     Principal Problem:   Atrial fibrillation with RVR (HCC) Active Problems:   Essential hypertension   Morbid obesity (HCC)   Acute on chronic systolic (congestive) heart failure (HCC)   Atrial fibrillation (HCC)    SUBJECTIVE  Feeling well. Walked around with RN and HR well controlled. Says he will make  It to all follow up appointments.   CURRENT MEDS . amiodarone  400 mg Oral BID  . carvedilol  12.5 mg Oral BID WC  . diltiazem  90 mg Oral QID  . enalapril  5 mg Oral BID  . furosemide  20 mg Oral Daily  . Warfarin - Physician Dosing Inpatient   Does not apply q1800    OBJECTIVE  Filed Vitals:   09/22/15 2220 09/22/15 2319 09/23/15 0324 09/23/15 0724  BP: 110/84 111/81 111/82 120/103  Pulse:      Temp:  97.8 F (36.6 C) 97.4 F (36.3 C) 97.4 F (36.3 C)  TempSrc:  Oral Oral Oral  Resp:    18  Height:      Weight:   321 lb 6.9 oz (145.8 kg)   SpO2:  97% 98% 97%    Intake/Output Summary (Last 24 hours) at 09/23/15 0810 Last data filed at 09/23/15 0700  Gross per 24 hour  Intake   1289 ml  Output    300 ml  Net    989 ml   Filed Weights   09/22/15 0330 09/22/15 1230 09/23/15 0324  Weight: 319 lb 14.2 oz (145.1 kg) 319 lb (144.697 kg) 321 lb 6.9 oz (145.8 kg)    PHYSICAL EXAM  General: Pleasant, NAD. obese Neuro: Alert and oriented X 3. Moves all extremities spontaneously. Psych: Normal affect. HEENT:  Normal  Neck: Supple without bruits or JVD. Lungs:  Resp regular and unlabored, CTA. Heart: irreg irreg no s3, s4, or murmurs. Abdomen: Soft, non-tender, non-distended, BS + x 4.  Extremities: No clubbing, cyanosis or edema. DP/PT/Radials 2+ and equal bilaterally.  Accessory Clinical Findings  CBC  Recent Labs  09/22/15 0230 09/23/15 0400  WBC 7.7 7.8  HGB 14.9 15.1  HCT 42.3 43.4  MCV 85.1 85.4  PLT 207 237   Basic Metabolic Panel  Recent Labs  09/20/15 1818 09/21/15 0755  09/22/15 0230  NA  --  138 139  K  --  4.3 3.8  CL  --  103 103  CO2  --  25 26  GLUCOSE  --  95 110*  BUN  --  11 15  CREATININE  --  0.91 1.02  CALCIUM  --  8.7* 8.6*  MG 1.8  --   --    Liver Function Tests  Recent Labs  09/20/15 1818  AST 37  ALT 74*  ALKPHOS 42  BILITOT 1.7*  PROT 6.1*  ALBUMIN 3.6    Cardiac Enzymes  Recent Labs  09/20/15 1818 09/20/15 2229 09/21/15 0755  TROPONINI 0.03 0.03 <0.03    Hemoglobin A1C  Recent Labs  09/20/15 1818  HGBA1C 5.0   Fasting Lipid Panel  Recent Labs  09/21/15 0735  CHOL 131  HDL 31*  LDLCALC 85  TRIG 76  CHOLHDL 4.2   Thyroid Function Tests  Recent Labs  09/20/15 1818  TSH 1.304    TELE  afib with some PVCs. HR currently 90 bpm  Radiology/Studies  Dg Chest Port 1 View  09/20/2015  CLINICAL DATA:  Short of breath.  Atrial  fibrillation. EXAM: PORTABLE CHEST 1 VIEW COMPARISON:  None. FINDINGS: Cardiac silhouette borderline enlarged. No mediastinal or hilar masses or evidence of adenopathy. There is central and lung base interstitial prominence accentuated by low lung volumes. There is additional linear opacity at the right lung base that is likely due to atelectasis. No convincing pneumonia. No pleural effusion or pneumothorax. Bony thorax is demineralized but grossly intact. IMPRESSION: 1. Cardiac silhouette borderline enlarged. Central and lower lung zone interstitial prominence. Mild congestive heart failure suspected. No evidence of pneumonia. Electronically Signed   By: Amie Portland M.D.   On: 09/20/2015 11:28    Study Date: 09/21/2015 LV EF: 30% -  35% Study Conclusions - Left ventricle: The cavity size was normal. There was severe concentric hypertrophy. Systolic function was moderately to severely reduced. The estimated ejection fraction was in the range of 30% to 35%. Diffuse hypokinesis. - Mitral valve: There was trivial regurgitation. - Left atrium: The atrium was severely  dilated. - LVEF may be underestimated due to atrial fibrillation.    ASSESSMENT AND PLAN Mr. Huy is a 49 y/o M with history of essential HTN, atrial fib, morbid obesity, prior cocaine abuse, chronic systolic CHF (EF 16-10% in 08/2014) who presented to [facility] with atrial fib RVR and acute on chronic systolic CHF. 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 He has not been taking any of his meds for >6 months.  1) AFib: He underwent TEE/CV 09/22/15 but converted back into afib after a few hours, which was not surprising given his severely dilated LA on 2D ECHO -- TSH normal but T4 slightly elevated.  --This patients CHA2DS2-VASc Score and unadjusted Ischemic Stroke Rate (% per year) is equal to 2.2 % stroke rate/year from a score of 2. He has been giving a 1 month free card for Eliquis and 10$ copay card. -- Seen by Dr. Ladona Ridgel who started amiodarone ( as this is his only AA option) with plans for repeat DCCV in 3-4 weeks. I will arrange follow up in the afib clinic next week and then with Dr. Ladona Ridgel in 1 month. -- Continue Amio  BID, Coreg  BID and Cardizem CD  for rate control. Rudi Coco to taper down amiodarone dose in 1 week.   2) Acute on chronic systolic CHF. Prior low EF at [facility] in 2015. Repeat 2D ECHO 09/21/15 with EF 30-35%, diffuse HK. Severe LA dilation.  -- Diuresed with IV lasix. Appears euvolemic and breathing back to baseline. Continued on Lasix  po qd. Creat stable.Discharge weight 321 lbs. -- Continue coreg 25 mg BID and Enalapril  BID  3) Obesity -- Longterm, he needs weight loss.   4) Prior cocaine use- UDS negative this admission   5) HTN- continue Coreg  BID and Enalapril  BID   6) Non compliance- this is a big issue for him. Per Dr. Ladona Ridgel, his non-compliance makes Tikosyn less than optimal, especially with QT prolongation. If down the road his ECG  demonstrates a reduction of the QT, could consider alternative AA drugs, especially if he can maintain his compliance.  SignedJanetta Hora PA-C  Pager [phone]  I have examined the patient and reviewed assessment and plan and discussed with patient.  Agree with above as stated.  Walked te patient and on medical theray, HR stayed below 120.  Loading Amiodarone.  F/u in AFib clinic.  Avoid cocaine and caffeine.  He seems to be motivated  to lose weight at this time, and to follow up regularly.    Known LV dysfunction from prior records.  Unclear whether ischemia testing has been done in the past.  Diffuse hypokinesis is more suggestive of nonischemic CM.  Would want to establish compliance.  No sx of ischemia at this time.  I think we can try to manage his rhythm at this time as his cardiomyopathy may be tachycardia mediated.  Increase beta blocker and decrease CCB given LV dysfunction.  Yeraldy Spike S.

## 2015-09-23 NOTE — Progress Notes (Signed)
ANTICOAGULATION CONSULT NOTE  Pharmacy Consult for heparin Indication: atrial fibrillation  No Known Allergies  Patient Measurements: Height: 5\' 11"  (180.3 cm) Weight: (!) 321 lb 6.9 oz (145.8 kg) IBW/kg (Calculated) : 75.3 Heparin Dosing Weight: 106 kg  Vital Signs: Temp: 97.4 F (36.3 C) (11/17 0324) Temp Source: Oral (11/17 0324) BP: 111/82 mmHg (11/17 0324)  Labs:  Recent Labs  09/20/15 1015 09/20/15 1818 09/20/15 2229 09/21/15 0755  09/21/15 1610 09/22/15 0230 09/23/15 0321 09/23/15 0400  HGB 16.7  --   --  15.3  --   --  14.9  --  15.1  HCT 47.0  --   --  44.3  --   --  42.3  --  43.4  PLT 228  --   --  198  --   --  207  --  237  LABPROT  --  16.3*  --   --   --   --   --  15.8*  --   INR  --  1.30  --   --   --   --   --  1.24  --   HEPARINUNFRC  --   --  <0.10*  --   < > 0.15* 0.34 0.87*  --   CREATININE 0.98  --   --  0.91  --   --  1.02  --   --   TROPONINI  --  0.03 0.03 <0.03  --   --   --   --   --   < > = values in this interval not displayed.  Estimated Creatinine Clearance: 126.8 mL/min (by C-G formula based on Cr of 1.02).  Assessment: 49 y.o. male with Afib currently for heparin Goal of Therapy:  Heparin level 0.3-0.7 units/ml Monitor platelets by anticoagulation protocol: Yes   Plan:  Decrease Heparin 2300 units/hr Check heparin level in 8 hours.    Geannie Risen, PharmD, BCPS    09/23/2015 4:59 AM

## 2015-09-23 NOTE — Discharge Summary (Addendum)
Discharge Summary   Patient ID: Luke Butler MRN: [id number], DOB/AGE: [birth year] 50 y.o. Admit date: 09/20/2015 D/C date:     09/23/2015  Primary Cardiologist: Dr. Eldridge Dace Primary Electrophysiologist: Dr. Ladona Ridgel  Principal Problem:   Atrial fibrillation with RVR [facility]) Active Problems:   Acute on chronic systolic (congestive) heart failure (HCC)   Essential hypertension   Morbid obesity (HCC)   Atrial fibrillation (HCC)   Non-compliance   Obesity    Admission Dates: 09/20/15-09/23/15 Discharge Diagnosis:  afib with RVR and acute on chronic systolic CHF  HPI: Luke Butler is a 49 y.o. male with a history of essential HTN, atrial fib, morbid obesity, prior cocaine abuse, chronic systolic CHF (EF 56-21% in 08/2014) who presented to [facility] with atrial fib RVR and acute on chronic systolic CHF. 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 He has not been taking any of his meds for >6 months.  In October 2015, he was admitted for similar symptoms at [facility] in [place]. He was diagnosed with AF RVR in the setting of excess diet Coke - he had consumed more than usual as he'd taken 4 planes in 2 days for work meetings. UDS was also positive for cocaine during that admission per [facility]where. He underwent TEE / DCCV the morning after admission 08/21/2014 with EF 30-35% / LV- normal size with moderate lobar hypokinesis / Mod MR / successful DCCV to normal sinus rhythm. He says he did not go back into atrial fib after the DCCV so it's unclear why Tikosyn specifically was chosen other than low EF. He recalls having a chemical test that did not show any blockages - hospital notes indicate CTA that ruled out PE, but plans for ischemic eval as outpatient. He was discharged with carvedilol, Tikosyn BID, enalapril, Lasix  daily, and Xarelto. He thought he had an episode of AF in January, but when he went to the ER to  get checked out in [place], everything apparently looked OK.  He was able to maintain the medicines for about 32mo, lost his insurance with some employment changes and about 32mo ago was unable to continue them, staying on ASA therapy alone for his AF.  He remains without insurance, though states he expects to have coverage with his current employer early next year.  He does state given he travels for his work, he found it difficult to keep the Tikosyn refilled and taken at the dosed times.  He denies any ongoing drug use, reports he has not used cocaine since 2015, he drinks about 3 glasses of wine weekend evenings, he has never smoked. He presented to [facility] on 09/20/15 with increasing SOB, orthopnea, and a heaviness in his chest and found to be in afib with RVR and acute on chronic CHF.   Hospital Course  1) AFib: He underwent TEE/CV 09/22/15 but converted back into afib after a few hours, which was not surprising given his severely dilated LA on 2D ECHO -- TSH normal but T4 slightly elevated.  --This patients CHA2DS2-VASc Score and unadjusted Ischemic Stroke Rate (% per year) is equal to 2.2 % stroke rate/year from a score of 2. He has been giving a 1 month free card for Eliquis and 10$ copay card. -- Seen by Dr. Ladona Ridgel who started amiodarone ( as this is his only AA option) with plans for repeat DCCV in 3-4 weeks. I will arrange follow up in the afib clinic  next week and then with Dr. Ladona Ridgel in 1 month. -- Continue Amio  BID, Coreg  BID and Cardizem CD  for rate control ( increased BB and decreased CCB in the setting of LV dysfunction). Rudi Coco to taper down amiodarone dose in 1 week. He will need a BMET in 1 week at follow up. -- Avoid cocaine and caffeine.  He seems to be motivated to lose weight at this time, and to follow up regularly.    2) Acute on chronic systolic CHF/NICM. Prior low EF at [facility] in 2015. Repeat 2D ECHO 09/21/15 with EF 30-35%, diffuse HK. Severe LA  dilation. Previous negative ischemic w/up per patient. No s/s ischemia -- Diuresed with IV lasix. Appears euvolemic and breathing back to baseline. Continued on Lasix  po qd. Creat stable. Discharge weight 321 lbs. At discharge lasix increased to  poqd an Kdur added. He will need a BMET in 1 week at follow up. -- Continue coreg 25 mg BID and Enalapril  BID  3) Obesity- longterm, he needs weight loss.   4) Prior cocaine use- UDS negative this admission   5) HTN- continue Coreg  BID, Cardizem CD  and Enalapril  BID   6) Non compliance- this is a big issue for him. Per Dr. Ladona Ridgel, his non-compliance makes Tikosyn less than optimal, especially with QT prolongation. If down the road his ECG demonstrates a reduction of the QT, could consider alternative AA drugs, especially if he can maintain his compliance.  The patient has had an uncomplicated hospital course and is recovering well. He has been seen by Dr. Eldridge Dace today and deemed ready for discharge home. All follow-up appointments have been scheduled. Discharge medications are listed below.   Discharge Vitals: Blood pressure 107/95, pulse 95, temperature 97.4 F (36.3 C), temperature source Oral, resp. rate 18, height  (1.803 m), weight 321 lb 6.9 oz (145.8 kg), SpO2 94 %.  Labs: Lab Results  Component Value Date   WBC 7.8 09/23/2015   HGB 15.1 09/23/2015   HCT 43.4 09/23/2015   MCV 85.4 09/23/2015   PLT 237 09/23/2015    Recent Labs Lab 09/20/15 1818  09/23/15 0903  NA  --   < > 140  K  --   < > 4.3  CL  --   < > 103  CO2  --   < > 27  BUN  --   < > 16  CREATININE  --   < > 1.10  CALCIUM  --   < > 9.2  PROT 6.1*  --   --   BILITOT 1.7*  --   --   ALKPHOS 42  --   --   ALT 74*  --   --   AST 37  --   --   GLUCOSE  --   < > 154*  < > = values in this interval not displayed.  Recent Labs  09/20/15 1818 09/20/15 2229 09/21/15 0755  TROPONINI 0.03 0.03 <0.03   Lab Results    Component Value Date   CHOL 131 09/21/2015   HDL 31* 09/21/2015   LDLCALC 85 09/21/2015   TRIG 76 09/21/2015     Diagnostic Studies/Procedures   Dg Chest Port 1 View  09/20/2015  CLINICAL DATA:  Short of breath.  Atrial fibrillation. EXAM: PORTABLE CHEST 1 VIEW COMPARISON:  None. FINDINGS: Cardiac silhouette borderline enlarged. No mediastinal or hilar masses or evidence of adenopathy. There is central and lung base interstitial  prominence accentuated by low lung volumes. There is additional linear opacity at the right lung base that is likely due to atelectasis. No convincing pneumonia. No pleural effusion or pneumothorax. Bony thorax is demineralized but grossly intact. IMPRESSION: 1. Cardiac silhouette borderline enlarged. Central and lower lung zone interstitial prominence. Mild congestive heart failure suspected. No evidence of pneumonia. Electronically Signed   By: Amie Portland M.D.   On: 09/20/2015 11:28   2D ECHO: 09/21/2015 LV EF: 30% -  35% Study Conclusions - Left ventricle: The cavity size was normal. There was severe concentric hypertrophy. Systolic function was moderately to severely reduced. The estimated ejection fraction was in the range of 30% to 35%. Diffuse hypokinesis. - Mitral valve: There was trivial regurgitation. - Left atrium: The atrium was severely dilated. - LVEF may be underestimated due to atrial fibrillation.   Discharge Medications     Medication List    STOP taking these medications        dofetilide 250 MCG capsule  Commonly known as:  TIKOSYN     rivaroxaban 20 MG Tabs tablet  Commonly known as:  XARELTO      TAKE these medications        amiodarone 400 MG tablet  Commonly known as:  PACERONE  Take 1 tablet (400 mg total) by mouth 2 (two) times daily.     apixaban 5 MG Tabs tablet  Commonly known as:  ELIQUIS  Take 1 tablet (5 mg total) by mouth 2 (two) times daily.     aspirin EC 81 MG tablet  Take 162 mg by mouth 2  (two) times daily.     carvedilol 25 MG tablet  Commonly known as:  COREG  Take 1 tablet (25 mg total) by mouth 2 (two) times daily.     diltiazem 240 MG 24 hr capsule  Commonly known as:  CARDIZEM CD  Take 1 capsule (240 mg total) by mouth daily.     enalapril 5 MG tablet  Commonly known as:  VASOTEC  Take 1 tablet (5 mg total) by mouth 2 (two) times daily.     furosemide 40 MG tablet  Commonly known as:  LASIX  Take 1 tablet (40 mg total) by mouth daily.     Potassium Chloride ER 20 MEQ Tbcr  Take 20 mEq by mouth daily.        Disposition   The patient will be discharged in stable condition to home.  Follow-up Information    Follow up with CARROLL,DONNA, NP On 09/29/2015.   Specialties:  Nurse Practitioner, Cardiology   Why:  @ 9am. In the heart and vascular center. Garage code [phone]. Please call [phone] if you have difficulties finding the building   Contact information:   [address] [place] [address] [phone]       Follow up with Lewayne Bunting, MD On 10/29/2015.   Specialty:  Cardiology   Why:  @ 11:15am   Contact information:   [address] [place] [address] [phone]         Duration of Discharge Encounter: Greater than 30 minutes including physician and PA time.  SignedCline Crock R PA-C 09/23/2015, 10:47 AM  I have examined the patient and reviewed assessment and plan and discussed with patient.  Agree with above as stated.     I have examined the patient and reviewed assessment and plan and discussed with patient.  Agree with above as stated.  Walked te patient  and on medical theray, HR stayed below 120.  Loading Amiodarone.  F/u in AFib clinic.  Avoid cocaine and caffeine.  He seems to be motivated to lose weight at this time, and to follow up regularly.    Known LV dysfunction from prior records.  Unclear whether ischemia testing has been done in the past.  Diffuse hypokinesis is more suggestive of  nonischemic CM.  Would want to establish compliance.  No sx of ischemia at this time.  I think we can try to manage his rhythm at this time as his cardiomyopathy may be tachycardia mediated.  Increase beta blocker and decrease CCB given LV dysfunction.  Daily Lasix 40 mg daily, KCL 20 Meq daily.  Will need BMet at next visit.  Meds updated  Corky Crafts, MD

## 2015-09-23 NOTE — Progress Notes (Signed)
Spoke to Cline Crock, [place] with cardiology about plan of care. Ambulated patient 750 ft; pt tolerated well, denied SOB. Heart rate maintained under 120bpm (max 119) while ambulating. Plan to discharge today after getting patient set up with Eliquis and d/c'ing heparin drip. Pt informed of plan and accepting of it, expressing that Eliquis is more feasible than coumadin since he travels too much to be compliant with coumadin lab work. Will continue to monitor.  Peyton Bottoms, RN 8:43 AM 09/23/2015

## 2015-09-23 NOTE — Progress Notes (Signed)
Pt to follow up w cardiology. Has eliquis 30day form and copay card when ins kicks in in [place]. Pt also has pt assist form for eliquis.

## 2015-09-23 NOTE — Progress Notes (Signed)
ANTICOAGULATION CONSULT NOTE  Pharmacy Consult for heparin Indication: atrial fibrillation  No Known Allergies  Patient Measurements: Height: 5\' 11"  (180.3 cm) Weight: (!) 321 lb 6.9 oz (145.8 kg) IBW/kg (Calculated) : 75.3 Heparin Dosing Weight: 106 kg  Vital Signs: Temp: 97.4 F (36.3 C) (11/17 0724) Temp Source: Oral (11/17 0724) BP: 120/103 mmHg (11/17 0724) Pulse Rate: 95 (11/17 0800)  Labs:  Recent Labs  09/20/15 1015 09/20/15 1818 09/20/15 2229 09/21/15 0755  09/21/15 1610 09/22/15 0230 09/23/15 0321 09/23/15 0400  HGB 16.7  --   --  15.3  --   --  14.9  --  15.1  HCT 47.0  --   --  44.3  --   --  42.3  --  43.4  PLT 228  --   --  198  --   --  207  --  237  LABPROT  --  16.3*  --   --   --   --   --  15.8*  --   INR  --  1.30  --   --   --   --   --  1.24  --   HEPARINUNFRC  --   --  <0.10*  --   < > 0.15* 0.34 0.87*  --   CREATININE 0.98  --   --  0.91  --   --  1.02  --   --   TROPONINI  --  0.03 0.03 <0.03  --   --   --   --   --   < > = values in this interval not displayed.  Estimated Creatinine Clearance: 126.8 mL/min (by C-G formula based on Cr of 1.02).  Assessment: 49 y.o. male with Afib on heparin and coumadin started 11/16.  Pharmacy has been consulted to transition to apixiban.  Patient is noted s/p cardioversion 11/16, spoke with Cline Crock- changing to apixiban due to coumadin compliance concerns. -Hg= 15.1, plt= 237 -SCr= 1.02, CrCl > 100  Goal of Therapy:  Monitor platelets by anticoagulation protocol: Yes   Plan:  -Discontinue heparin -Begin apixiban 5mg  po bid  Harland German, Pharm D 09/23/2015 8:39 AM

## 2015-09-23 NOTE — Progress Notes (Signed)
Central monitoring called at 0944 to notify taking pt off the telly monitor for discharge. Pt currently showering. Will go over discharge papers with pt later this morning.  Peyton Bottoms, RN 10:01 AM 09/23/2015

## 2015-09-23 NOTE — Progress Notes (Signed)
Went over AVS with pt and gave him a copy to keep as well as a heart failure education folder. Pt has Eliquis card with him. Pt declined wheelchair, walked with a friend out of unit with his belongings.  Peyton Bottoms, RN 11:09 AM 09/23/2015

## 2015-09-29 ENCOUNTER — Encounter (HOSPITAL_COMMUNITY): Payer: Self-pay | Admitting: Nurse Practitioner

## 2015-09-29 ENCOUNTER — Ambulatory Visit (HOSPITAL_COMMUNITY)
Admit: 2015-09-29 | Discharge: 2015-09-29 | Disposition: A | Discharge status: Home / Self Care | Payer: Self-pay | Source: Ambulatory Visit | Attending: Nurse Practitioner | Admitting: Nurse Practitioner

## 2015-09-29 VITALS — BP 114/76 | HR 86 | Ht 71.0 in | Wt 317.0 lb

## 2015-09-29 DIAGNOSIS — I481 Persistent atrial fibrillation: Secondary | ICD-10-CM | POA: Insufficient documentation

## 2015-09-29 DIAGNOSIS — E669 Obesity, unspecified: Secondary | ICD-10-CM | POA: Insufficient documentation

## 2015-09-29 DIAGNOSIS — I519 Heart disease, unspecified: Secondary | ICD-10-CM | POA: Insufficient documentation

## 2015-09-29 DIAGNOSIS — I4819 Other persistent atrial fibrillation: Secondary | ICD-10-CM

## 2015-09-29 LAB — BASIC METABOLIC PANEL
ANION GAP: 8 (ref 5–15)
BUN: 23 mg/dL — AB (ref 6–20)
CALCIUM: 9.3 mg/dL (ref 8.9–10.3)
CO2: 28 mmol/L (ref 22–32)
Chloride: 105 mmol/L (ref 101–111)
Creatinine, Ser: 1.07 mg/dL (ref 0.61–1.24)
GFR calc Af Amer: >60 mL/min (ref >60)
GFR calc non Af Amer: >60 mL/min (ref >60)
GLUCOSE: 107 mg/dL — AB (ref 65–99)
Potassium: 4.7 mmol/L (ref 3.5–5.1)
Sodium: 141 mmol/L (ref 135–145)

## 2015-09-29 NOTE — Patient Instructions (Signed)
Your physician has recommended you make the following change in your medication:  1)Do not have amiodarone filled 2)Start potassium once daily  Follow up with Dr. Ladona Ridgel as scheduled.

## 2015-09-29 NOTE — Progress Notes (Signed)
Patient ID: Luke Butler, male   DOB: Oct 23, 1966, 49 y.o.   MRN: [id number]     Primary Care Physician: No PCP Per Patient Referring Physician: [facility] f/u    Luke Butler is a 50 y.o. male with a h/o essential HTN, atrial fib, morbid obesity, prior cocaine abuse, chronic systolic CHF (EF 93-81% in 08/2014) who presented to [facility] with atrial fib RVR and acute on chronic systolic CHF. 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 He has not been taking any of his meds for >6 months, due to change in jobs and change in insurance status..  In October 2015, he was admitted for similar symptoms at [facility] in [place]. He was diagnosed with AF RVR in the setting of excess diet Coke - he had consumed more than usual as he'd taken 4 planes in 2 days for work meetings. UDS was also positive for cocaine during that admission per [facility]where. He underwent TEE / DCCV the morning after admission 08/21/2014 with EF 30-35% / LV- normal size with moderate lobar hypokinesis / Mod MR / successful DCCV to normal sinus rhythm. He says he did not go back into atrial fib after the DCCV so it's unclear why Tikosyn specifically was chosen other than low EF. He recalls having a chemical test that did not show any blockages - hospital notes indicate CTA that ruled out PE, but plans for ischemic eval as outpatient. He was discharged with carvedilol, Tikosyn BID, enalapril, Lasix 20mg  daily, and Xarelto. He thought he had an episode of AF in January, but when he went to the ER to get checked out in [place], everything apparently looked OK.  He was able to maintain the medicines for about 59mo, lost his insurance with some employment changes and about 73mo ago was unable to continue them, staying on ASA therapy alone for his AF. He remains without insurance, though states he expects to have coverage with his current employer early next year. He does  state given he travels for his work, he found it difficult to keep the Tikosyn refilled and taken at the dosed times. He denies any ongoing drug use, reports he has not used cocaine since 2015, he drinks about 3 glasses of wine weekend evenings, he has never smoked. He presented to [facility] on 09/20/15 with increasing SOB, orthopnea, and a heaviness in his chest and found to be in afib with RVR and acute on chronic CHF. He underwent TEE/CV 09/22/15 but converted back into afib after a few hours, which was not surprising given his severely dilated LA on 2D ECHO. TSH normal but T4 slightly elevated.   CHA2DS2-VASc Score and unadjusted Ischemic Stroke Rate (% per year) is equal to 2.2 % stroke rate/year from a score of 2. He has been giving a 1 month free card for Eliquis and 10$ copay card. Seen by Dr. Ladona Ridgel who started amiodarone ( as this is his only AA option) with plans for repeat DCCV in 3-4 weeks. I will arrange follow up in the afib clinic next week and then with Dr. Ladona Ridgel in 1 month. Amio 400mg  BID, Coreg 25mg  BID and Cardizem CD 240mg  for rate control ( increased BB and decreased CCB in the setting of LV dysfunction). Rudi Coco to taper down amiodarone dose in 1 week. He will need a BMET in 1 week at follow up.  In the afib clinic on f/u, he reports that he feels  great. His energy is good, he is sleeping well. Does not feel the afib. He shows some confusion re his drugs and reports that 7 prescritptions were sent to the drugstore on d/c but when he picked them up there were only 5. A call was place to the drugstore and he did not receive his amiodarone or his potassium. EKG shows afib rate controlled, still has long qtc at baseline of 507 ms. I called Dr. Ladona Ridgel re that the pt never started amiodarone and he relayed if he is feeling well and rate controlled, just hold off with the amiodarone at this time. He has lost 5 lbs, limiting caffeine, alcohol, eating low carb and limiting salt.   Today, he  denies symptoms of palpitations, chest pain, shortness of breath, orthopnea, PND, lower extremity edema, dizziness, presyncope, syncope, or neurologic sequela. The patient is tolerating medications without difficulties and is otherwise without complaint today.   Past Medical History  Diagnosis Date  . Essential hypertension   . Paroxysmal atrial fibrillation (HCC)     a. Dx in [place] ([place]) in 2015, in setting of excess Diet Coke. s/p TEE/DCCV and then Tikosyn/Xarelto.   B. s/p unsuccessful DCCV 09/22/15: placed on amio. Plan for rate control and repeat DCCV in 1 month with amio   . Morbid obesity (HCC)   . Chronic systolic CHF (congestive heart failure) (HCC)     a. [place] 08/21/2014: TEE / DCCV- EF: 30-35% / LV- normal size with moderate lobar hypokinesis / Mod MR / Successful DCCV to normal sinus rhythm.  . History of cocaine use   . Non-compliance   . Obesity    Past Surgical History  Procedure Laterality Date  . Carpal tunnel release    . Tee without cardioversion N/A 09/22/2015    Procedure: TRANSESOPHAGEAL ECHOCARDIOGRAM (TEE);  Surgeon: Chrystie Nose, MD;  Location: [facility] ENDOSCOPY;  Service: Cardiovascular;  Laterality: N/A;  . Cardioversion N/A 09/22/2015    Procedure: CARDIOVERSION;  Surgeon: Chrystie Nose, MD;  Location: [facility] ENDOSCOPY;  Service: Cardiovascular;  Laterality: N/A;    Current Outpatient Prescriptions  Medication Sig Dispense Refill  . apixaban (ELIQUIS) 5 MG TABS tablet Take 1 tablet (5 mg total) by mouth 2 (two) times daily. 60 tablet 1  . carvedilol (COREG) 25 MG tablet Take 1 tablet (25 mg total) by mouth 2 (two) times daily. 60 tablet 1  . diltiazem (CARDIZEM CD) 240 MG 24 hr capsule Take 1 capsule (240 mg total) by mouth daily. 30 capsule 1  . enalapril (VASOTEC) 5 MG tablet Take 1 tablet (5 mg total) by mouth 2 (two) times daily. 60 tablet 1  . furosemide (LASIX) 40 MG tablet Take 1 tablet (40 mg total) by mouth daily. 30 tablet 1  . potassium chloride 20  MEQ TBCR Take 20 mEq by mouth daily. 30 tablet 1   No current facility-administered medications for this encounter.    No Known Allergies  Social History   Social History  . Marital Status: Single    Spouse Name: N/A  . Number of Children: N/A  . Years of Education: N/A   Occupational History  .      Travel Transport planner   Social History Main Topics  . Smoking status: Never Smoker   . Smokeless tobacco: Never Used  . Alcohol Use: 4.2 oz/week    7 Glasses of wine per week     Comment: Drinks Fri/Sat - 3-4 glasses of wine at a time  . Drug  Use: Yes     Comment: H/o prior cocaine use  . Sexual Activity: Not on file   Other Topics Concern  . Not on file   Social History Narrative    Family History  Problem Relation Age of Onset  . Heart disease Neg Hx     ROS- All systems are reviewed and negative except as per the HPI above  Physical Exam: Filed Vitals:   09/29/15 0908  BP: 114/76  Pulse: 86  Height:  (1.803 m)  Weight: 317 lb (143.79 kg)    GEN- The patient is well appearing, alert and oriented x 3 today.   Head- normocephalic, atraumatic Eyes-  Sclera clear, conjunctiva pink Ears- hearing intact Oropharynx- clear Neck- supple, no JVP Lymph- no cervical lymphadenopathy Lungs- Clear to ausculation bilaterally, normal work of breathing Heart- Regular rate and rhythm, no murmurs, rubs or gallops, PMI not laterally displaced GI- soft, NT, ND, + BS Extremities- no clubbing, cyanosis, or edema MS- no significant deformity or atrophy Skin- no rash or lesion Psych- euthymic mood, full affect Neuro- strength and sensation are intact  EKG- Afib 83 bpm, qrs int 80 ms, qtc 439 ms Epic records reviewed  Assessment and Plan: 1. afib Currently feeling much better but never started amiodarone due to drug store having to order. Per Dr. Ladona Ridgel if pt rate controlled and asymptomatic, stay off amiodarone for now. Continue diltiazem for rate control  2. LV  dysfunction  Continue ACE/bb/lasix Bmet pending Has not started K+, will let him know if needed depending on lab results. Avoid salt  3. Obesity Has lost 5 lbs Continue low carb diet Increase activity Limit alcohol Limit caffeine  F/u with  with Dr. Ladona Ridgel 12/21 afib clinic as needed

## 2015-10-27 ENCOUNTER — Encounter: Payer: Self-pay | Admitting: Internal Medicine

## 2015-10-27 ENCOUNTER — Ambulatory Visit (INDEPENDENT_AMBULATORY_CARE_PROVIDER_SITE_OTHER): Payer: Self-pay | Admitting: Internal Medicine

## 2015-10-27 ENCOUNTER — Other Ambulatory Visit: Payer: Self-pay | Admitting: Physician Assistant

## 2015-10-27 VITALS — BP 130/92 | HR 88 | Ht 71.0 in | Wt 318.8 lb

## 2015-10-27 DIAGNOSIS — I10 Essential (primary) hypertension: Secondary | ICD-10-CM

## 2015-10-27 DIAGNOSIS — I4819 Other persistent atrial fibrillation: Secondary | ICD-10-CM

## 2015-10-27 DIAGNOSIS — I4891 Unspecified atrial fibrillation: Secondary | ICD-10-CM

## 2015-10-27 DIAGNOSIS — I481 Persistent atrial fibrillation: Secondary | ICD-10-CM

## 2015-10-27 NOTE — Progress Notes (Signed)
HPI Luke Butler returns today for ongoing evaluation and management of his atrial fib, HTN, and CHF. Since he was discharged from the hospital, he has done well. He has tried to improve his diet and lose weight and eat less salt. He feels better. No palpitations.  No Known Allergies   Current Outpatient Prescriptions  Medication Sig Dispense Refill  . apixaban (ELIQUIS) 5 MG TABS tablet Take 1 tablet (5 mg total) by mouth 2 (two) times daily. 60 tablet 1  . carvedilol (COREG) 25 MG tablet Take 1 tablet (25 mg total) by mouth 2 (two) times daily. 60 tablet 1  . diltiazem (CARDIZEM CD) 240 MG 24 hr capsule Take 1 capsule (240 mg total) by mouth daily. 30 capsule 1  . enalapril (VASOTEC) 5 MG tablet Take 1 tablet (5 mg total) by mouth 2 (two) times daily. 60 tablet 1  . furosemide (LASIX) 40 MG tablet Take 1 tablet (40 mg total) by mouth daily. 30 tablet 1  . potassium chloride 20 MEQ TBCR Take 20 mEq by mouth daily. (Patient not taking: Reported on 10/27/2015) 30 tablet 1   No current facility-administered medications for this visit.     Past Medical History  Diagnosis Date  . Essential hypertension   . Paroxysmal atrial fibrillation (HCC)     a. Dx in [place] ([place]) in 2015, in setting of excess Diet Coke. s/p TEE/DCCV and then Tikosyn/Xarelto.   B. s/p unsuccessful DCCV 09/22/15: placed on amio. Plan for rate control and repeat DCCV in 1 month with amio   . Morbid obesity (HCC)   . Chronic systolic CHF (congestive heart failure) (HCC)     a. [place] 08/21/2014: TEE / DCCV- EF: 30-35% / LV- normal size with moderate lobar hypokinesis / Mod MR / Successful DCCV to normal sinus rhythm.  . History of cocaine use   . Non-compliance   . Obesity     ROS:   All systems reviewed and negative except as noted in the HPI.   Past Surgical History  Procedure Laterality Date  . Carpal tunnel release    . Tee without cardioversion N/A 09/22/2015    Procedure: TRANSESOPHAGEAL  ECHOCARDIOGRAM (TEE);  Surgeon: Chrystie Nose, MD;  Location: [facility] ENDOSCOPY;  Service: Cardiovascular;  Laterality: N/A;  . Cardioversion N/A 09/22/2015    Procedure: CARDIOVERSION;  Surgeon: Chrystie Nose, MD;  Location: [facility] ENDOSCOPY;  Service: Cardiovascular;  Laterality: N/A;     Family History  Problem Relation Age of Onset  . Heart disease Neg Hx      Social History   Social History  . Marital Status: Single    Spouse Name: N/A  . Number of Children: N/A  . Years of Education: N/A   Occupational History  .      Travel Transport planner   Social History Main Topics  . Smoking status: Never Smoker   . Smokeless tobacco: Never Used  . Alcohol Use: 4.2 oz/week    7 Glasses of wine per week     Comment: Drinks Fri/Sat - 3-4 glasses of wine at a time  . Drug Use: Yes     Comment: H/o prior cocaine use  . Sexual Activity: Not on file   Other Topics Concern  . Not on file   Social History Narrative     BP 130/92 mmHg  Pulse 88  Ht  (1.803 m)  Wt 318 lb 12.8 oz (144.607 kg)  BMI 44.48 kg/m2  Physical Exam:  Well appearing NAD HEENT: Unremarkable Neck:  No JVD, no thyromegally Lymphatics:  No adenopathy Back:  No CVA tenderness Lungs:  Clear with no wheezes HEART:  Regular rate rhythm, no murmurs, no rubs, no clicks Abd:  soft, positive bowel sounds, no organomegally, no rebound, no guarding Ext:  2 plus pulses, no edema, no cyanosis, no clubbing Skin:  No rashes no nodules Neuro:  CN II through XII intact, motor grossly intact  EKG - atrial fib with a CVR   Assess/Plan:

## 2015-10-27 NOTE — Assessment & Plan Note (Signed)
His blood pressure is controlled. He will continue his current medications. 

## 2015-10-27 NOTE — Patient Instructions (Signed)

## 2015-10-27 NOTE — Assessment & Plan Note (Signed)
He is trying to lose weight. Will hope for additional weight loss.

## 2015-10-27 NOTE — Assessment & Plan Note (Signed)
His ventricular rate is controlled. He will continue with a strategy of rate control. I have encouraged him to lose weight and reduce his ETOH consumption and increase his physical activity.

## 2015-11-08 ENCOUNTER — Other Ambulatory Visit: Payer: Self-pay | Admitting: Physician Assistant

## 2015-11-24 ENCOUNTER — Other Ambulatory Visit: Payer: Self-pay | Admitting: Internal Medicine

## 2015-11-24 MED ORDER — DILTIAZEM HCL ER COATED BEADS 240 MG PO CP24: 240.0000 mg | ORAL_CAPSULE | Freq: Every day | ORAL | Status: DC

## 2015-11-24 MED ORDER — ENALAPRIL MALEATE 5 MG PO TABS: 5.0000 mg | ORAL_TABLET | Freq: Two times a day (BID) | ORAL | Status: DC

## 2015-11-24 MED ORDER — CARVEDILOL 25 MG PO TABS: 25.0000 mg | ORAL_TABLET | Freq: Two times a day (BID) | ORAL | Status: DC

## 2015-12-29 ENCOUNTER — Telehealth: Payer: Self-pay | Admitting: Internal Medicine

## 2015-12-29 MED ORDER — ENALAPRIL MALEATE 5 MG PO TABS: 5.0000 mg | ORAL_TABLET | Freq: Two times a day (BID) | ORAL | Status: DC

## 2015-12-29 MED ORDER — DILTIAZEM HCL ER COATED BEADS 240 MG PO CP24: 240.0000 mg | ORAL_CAPSULE | Freq: Every day | ORAL | Status: DC

## 2015-12-29 NOTE — Telephone Encounter (Signed)
Pharmacy called for pt because he needed refills.

## 2016-02-01 ENCOUNTER — Other Ambulatory Visit: Payer: Self-pay | Admitting: Internal Medicine

## 2016-02-02 ENCOUNTER — Other Ambulatory Visit: Payer: Self-pay | Admitting: *Deleted

## 2016-02-02 MED ORDER — DILTIAZEM HCL ER COATED BEADS 240 MG PO CP24: 240.0000 mg | ORAL_CAPSULE | Freq: Every day | ORAL | Status: DC

## 2016-02-02 MED ORDER — CARVEDILOL 25 MG PO TABS: 25.0000 mg | ORAL_TABLET | Freq: Two times a day (BID) | ORAL | Status: DC

## 2016-02-02 MED ORDER — APIXABAN 5 MG PO TABS: 5.0000 mg | ORAL_TABLET | Freq: Two times a day (BID) | ORAL | Status: DC

## 2016-02-02 NOTE — Telephone Encounter (Signed)
Follow up       *STAT* If patient is at the pharmacy, call can be transferred to refill team.   1. Which medications need to be refilled? (please list name of each medication and dose if known) carvedilol 25mg , enalapril 5mg , cardizem CD 240mg  2. Which pharmacy/location (including street and city if local pharmacy) is medication to be sent to?[facility] in [place]  3. Do they need a 30 day or 90 day supply? 30 day Need new prescriptions please

## 2016-07-22 ENCOUNTER — Other Ambulatory Visit: Payer: Self-pay | Admitting: Physician Assistant

## 2016-07-22 DIAGNOSIS — I480 Paroxysmal atrial fibrillation: Secondary | ICD-10-CM

## 2016-07-22 MED ORDER — CARVEDILOL 25 MG PO TABS: 25.0000 mg | ORAL_TABLET | Freq: Two times a day (BID) | ORAL | 1 refills | Status: DC

## 2016-07-22 NOTE — Progress Notes (Signed)
Pharmacy called.  Needs refills on Coreg. 90 day supply with 1 refill sent in. Tereso Newcomer, PA-C   07/22/2016 9:49 AM

## 2016-10-21 ENCOUNTER — Other Ambulatory Visit: Payer: Self-pay | Admitting: Internal Medicine

## 2016-10-23 ENCOUNTER — Other Ambulatory Visit: Payer: Self-pay | Admitting: *Deleted

## 2016-10-23 MED ORDER — DILTIAZEM HCL ER COATED BEADS 240 MG PO CP24: 240.0000 mg | ORAL_CAPSULE | Freq: Every day | ORAL | 0 refills | Status: DC

## 2016-12-23 ENCOUNTER — Other Ambulatory Visit: Payer: Self-pay | Admitting: Internal Medicine

## 2016-12-26 ENCOUNTER — Other Ambulatory Visit: Payer: Self-pay | Admitting: *Deleted

## 2016-12-26 ENCOUNTER — Telehealth: Payer: Self-pay | Admitting: Internal Medicine

## 2016-12-26 MED ORDER — DILTIAZEM HCL ER COATED BEADS 240 MG PO CP24: 240.0000 mg | ORAL_CAPSULE | Freq: Every day | ORAL | 0 refills | Status: DC

## 2016-12-26 NOTE — Telephone Encounter (Signed)
Spoke with patient and made him aware that these rx's were sent already today to the [facility] in [place], which is where the request came from. I made him aware that he just needs to request that they be transferred to the [facility] in [place]. I also made patient aware that he is overdue for an appointment. He requested to schedule an appointment and I transferred him to scheduling.

## 2016-12-26 NOTE — Telephone Encounter (Signed)
°*  STAT* If patient is at the pharmacy, call can be transferred to refill team.   1. Which medications need to be refilled? (please list name of each medication and dose if known)  Enalapril,Lasix and Cartia- Pt isis  In the store-he is visting in [place]--need these today 2. Which pharmacy/location (including street and city if local pharmacy) is medication to be sent to?[phone]  3. Do they need a 30 day or 90 day supply- did not know

## 2017-01-03 ENCOUNTER — Encounter: Payer: Self-pay | Admitting: Internal Medicine

## 2017-01-03 ENCOUNTER — Ambulatory Visit (INDEPENDENT_AMBULATORY_CARE_PROVIDER_SITE_OTHER): Payer: Self-pay | Admitting: Internal Medicine

## 2017-01-03 VITALS — BP 110/80 | HR 87 | Ht 71.0 in | Wt 335.6 lb

## 2017-01-03 DIAGNOSIS — I481 Persistent atrial fibrillation: Secondary | ICD-10-CM

## 2017-01-03 DIAGNOSIS — I10 Essential (primary) hypertension: Secondary | ICD-10-CM

## 2017-01-03 DIAGNOSIS — I4819 Other persistent atrial fibrillation: Secondary | ICD-10-CM

## 2017-01-03 MED ORDER — FUROSEMIDE 40 MG PO TABS: 40.0000 mg | ORAL_TABLET | Freq: Every day | ORAL | 11 refills | Status: DC

## 2017-01-03 MED ORDER — CARVEDILOL 25 MG PO TABS: 25.0000 mg | ORAL_TABLET | Freq: Two times a day (BID) | ORAL | 11 refills | Status: DC

## 2017-01-03 MED ORDER — DILTIAZEM HCL ER COATED BEADS 240 MG PO CP24
240.0000 mg | ORAL_CAPSULE | Freq: Every day | ORAL | 11 refills | Status: DC
Start: 2017-01-03 — End: 2018-01-05

## 2017-01-03 MED ORDER — ENALAPRIL MALEATE 5 MG PO TABS: 5.0000 mg | ORAL_TABLET | Freq: Two times a day (BID) | ORAL | 11 refills | Status: DC

## 2017-01-03 NOTE — Patient Instructions (Signed)

## 2017-01-03 NOTE — Progress Notes (Signed)
HPI Luke Butler returns today for ongoing evaluation and management of his atrial fib, HTN, and CHF. He has been stable over the past year except that his weight has been up and down and back up. He is trying to lose weight again. He does not have palpitations. No chest pain. He remains active working but is not exercising much.  No Known Allergies   Current Outpatient Prescriptions  Medication Sig Dispense Refill  . aspirin 325 MG EC tablet Take 325 mg by mouth daily.    . carvedilol (COREG) 25 MG tablet Take 1 tablet (25 mg total) by mouth 2 (two) times daily. 180 tablet 1  . diltiazem (CARDIZEM CD) 240 MG 24 hr capsule Take 1 capsule (240 mg total) by mouth daily. *Patient is overdue for an appointment and must call and schedule for further refills* 15 capsule 0  . enalapril (VASOTEC) 5 MG tablet Take 1 tablet (5 mg total) by mouth 2 (two) times daily. *Patient is overdue for an appt and must call and schedule for further refills* 30 tablet 0  . furosemide (LASIX) 40 MG tablet Take 1 tablet (40 mg total) by mouth daily. *Patient is overdue for an appointment and must call and schedule for further refills* 15 tablet 0   No current facility-administered medications for this visit.      Past Medical History:  Diagnosis Date  . Chronic systolic CHF (congestive heart failure) (HCC)    a. [place] 08/21/2014: TEE / DCCV- EF: 30-35% / LV- normal size with moderate lobar hypokinesis / Mod MR / Successful DCCV to normal sinus rhythm.  . Essential hypertension   . History of cocaine use   . Morbid obesity (HCC)   . Non-compliance   . Obesity   . Paroxysmal atrial fibrillation (HCC)    a. Dx in [place] ([place]) in 2015, in setting of excess Diet Coke. s/p TEE/DCCV and then Tikosyn/Xarelto.   B. s/p unsuccessful DCCV 09/22/15: placed on amio. Plan for rate control and repeat DCCV in 1 month with amio     ROS:   All systems reviewed and negative except as noted in the HPI.   Past Surgical  History:  Procedure Laterality Date  . CARDIOVERSION N/A 09/22/2015   Procedure: CARDIOVERSION;  Surgeon: Chrystie Nose, MD;  Location: [facility] ENDOSCOPY;  Service: Cardiovascular;  Laterality: N/A;  . CARPAL TUNNEL RELEASE    . TEE WITHOUT CARDIOVERSION N/A 09/22/2015   Procedure: TRANSESOPHAGEAL ECHOCARDIOGRAM (TEE);  Surgeon: Chrystie Nose, MD;  Location: [facility] ENDOSCOPY;  Service: Cardiovascular;  Laterality: N/A;     Family History  Problem Relation Age of Onset  . Heart disease Neg Hx      Social History   Social History  . Marital status: Single    Spouse name: N/A  . Number of children: N/A  . Years of education: N/A   Occupational History  .      Travel Transport planner   Social History Main Topics  . Smoking status: Never Smoker  . Smokeless tobacco: Never Used  . Alcohol use 4.2 oz/week    7 Glasses of wine per week     Comment: Drinks Fri/Sat - 3-4 glasses of wine at a time  . Drug use: Yes     Comment: H/o prior cocaine use  . Sexual activity: Not on file   Other Topics Concern  . Not on file   Social History Narrative  . No narrative on file  BP 110/80   Pulse 87   Ht 5\' 11"  (1.803 m)   Wt (!) 335 lb 9.6 oz (152.2 kg)   SpO2 97%   BMI 46.81 kg/m   Physical Exam:  obese appearing 51 yo man, NAD HEENT: Unremarkable Neck:  6 cm JVD, no thyromegally Lymphatics:  No adenopathy Back:  No CVA tenderness Lungs:  Clear with no wheezes HEART:  IRegular Irregular rate rhythm, no murmurs, no rubs, no clicks Abd:  soft, positive bowel sounds, no organomegally, no rebound, no guarding Ext:  2 plus pulses, no edema, no cyanosis, no clubbing Skin:  No rashes no nodules Neuro:  CN II through XII intact, motor grossly intact  EKG - atrial fib with a CVR   Assess/Plan: 1. Persistent atrial fib - his ventricular rate is controlled. He will continue his current meds. 2. Obesity - his weight remains elevated and I have encouraged him to lose weight and to  keep it off. 3. Coags - he stopped Eliquis and is not willing to take warfarin. I encouraged him to reconsider an OAC. 4. Chronic systolic heart failure - he has a tachy induced CM. At this point, he will continue the same meds. I strongly suspect his LV function has normalized.   Luke Butler.D.

## 2018-01-05 ENCOUNTER — Other Ambulatory Visit: Payer: Self-pay | Admitting: Internal Medicine

## 2018-01-25 ENCOUNTER — Telehealth: Payer: Self-pay | Admitting: Internal Medicine

## 2018-01-25 MED ORDER — DILTIAZEM HCL ER COATED BEADS 240 MG PO CP24: 240.0000 mg | ORAL_CAPSULE | Freq: Every day | ORAL | 11 refills | Status: DC

## 2018-01-25 MED ORDER — FUROSEMIDE 40 MG PO TABS: 40.0000 mg | ORAL_TABLET | Freq: Every day | ORAL | 11 refills | Status: DC

## 2018-01-25 MED ORDER — CARVEDILOL 25 MG PO TABS: 25.0000 mg | ORAL_TABLET | Freq: Two times a day (BID) | ORAL | 11 refills | Status: DC

## 2018-01-25 MED ORDER — ENALAPRIL MALEATE 5 MG PO TABS: 5.0000 mg | ORAL_TABLET | Freq: Two times a day (BID) | ORAL | 11 refills | Status: DC

## 2018-01-25 NOTE — Telephone Encounter (Signed)
Call placed to Pt.  Pt needs refills on meds, was not scheduled with Dr. Ladona Ridgel until June.  Moved Pt to April.  Refilled meds.   No further action needed at this time.

## 2018-01-25 NOTE — Telephone Encounter (Signed)
New Message:    Pt has appt with dr. Ladona Ridgel on 6/11 but pt is stating her is out of his medication.  furosemide (LASIX) 40 MG tablet  enalapril (VASOTEC) 5 MG tablet  diltiazem (CARTIA XT) 240 MG 24 hr capsule  carvedilol (COREG) 25 MG tablet

## 2018-02-20 ENCOUNTER — Ambulatory Visit (INDEPENDENT_AMBULATORY_CARE_PROVIDER_SITE_OTHER): Payer: Self-pay | Admitting: Internal Medicine

## 2018-02-20 ENCOUNTER — Encounter: Payer: Self-pay | Admitting: Internal Medicine

## 2018-02-20 VITALS — BP 150/98 | HR 94 | Ht 71.0 in | Wt 330.0 lb

## 2018-02-20 DIAGNOSIS — I4819 Other persistent atrial fibrillation: Secondary | ICD-10-CM

## 2018-02-20 DIAGNOSIS — I481 Persistent atrial fibrillation: Secondary | ICD-10-CM

## 2018-02-20 DIAGNOSIS — I5022 Chronic systolic (congestive) heart failure: Secondary | ICD-10-CM

## 2018-02-20 MED ORDER — FUROSEMIDE 40 MG PO TABS: 40.0000 mg | ORAL_TABLET | Freq: Every day | ORAL | 11 refills | Status: DC

## 2018-02-20 MED ORDER — CARVEDILOL 25 MG PO TABS: 25.0000 mg | ORAL_TABLET | Freq: Two times a day (BID) | ORAL | 11 refills | Status: DC

## 2018-02-20 MED ORDER — DILTIAZEM HCL ER COATED BEADS 240 MG PO CP24: 240.0000 mg | ORAL_CAPSULE | Freq: Every day | ORAL | 11 refills | Status: DC

## 2018-02-20 MED ORDER — ENALAPRIL MALEATE 5 MG PO TABS: 5.0000 mg | ORAL_TABLET | Freq: Two times a day (BID) | ORAL | 11 refills | Status: DC

## 2018-02-20 NOTE — Progress Notes (Signed)
HPI [name] returns today for ongoing evaluation and management of his atrial fib, HTN, and CHF. He has been stable over the past year except that his weight has been up and down and back up. He is trying to lose weight again. He does not have palpitations. No chest pain. He remains active working but is not exercising much. His weight is up and down. He has palpitations but denies CHF.   No Known Allergies   Current Outpatient Medications  Medication Sig Dispense Refill  . aspirin 325 MG EC tablet Take 325 mg by mouth daily.    . carvedilol (COREG) 25 MG tablet Take 1 tablet (25 mg total) by mouth 2 (two) times daily. 30 tablet 11  . diltiazem (CARTIA XT) 240 MG 24 hr capsule Take 1 capsule (240 mg total) by mouth daily. 30 capsule 11  . enalapril (VASOTEC) 5 MG tablet Take 1 tablet (5 mg total) by mouth 2 (two) times daily. 30 tablet 11  . furosemide (LASIX) 40 MG tablet Take 1 tablet (40 mg total) by mouth daily. 30 tablet 11   No current facility-administered medications for this visit.      Past Medical History:  Diagnosis Date  . Chronic systolic CHF (congestive heart failure) (HCC)    a. [place] 08/21/2014: TEE / DCCV- EF: 30-35% / LV- normal size with moderate lobar hypokinesis / Mod MR / Successful DCCV to normal sinus rhythm.  . Essential hypertension   . History of cocaine use   . Morbid obesity (HCC)   . Non-compliance   . Obesity   . Paroxysmal atrial fibrillation (HCC)    a. Dx in [place] ([place]) in 2015, in setting of excess Diet Coke. s/p TEE/DCCV and then Tikosyn/Xarelto.   B. s/p unsuccessful DCCV 09/22/15: placed on amio. Plan for rate control and repeat DCCV in 1 month with amio     ROS:   All systems reviewed and negative except as noted in the HPI.   Past Surgical History:  Procedure Laterality Date  . CARDIOVERSION N/A 09/22/2015   Procedure: CARDIOVERSION;  Surgeon: Chrystie Nose, MD;  Location: [facility] ENDOSCOPY;  Service: Cardiovascular;   Laterality: N/A;  . CARPAL TUNNEL RELEASE    . TEE WITHOUT CARDIOVERSION N/A 09/22/2015   Procedure: TRANSESOPHAGEAL ECHOCARDIOGRAM (TEE);  Surgeon: Chrystie Nose, MD;  Location: [facility] ENDOSCOPY;  Service: Cardiovascular;  Laterality: N/A;     Family History  Problem Relation Age of Onset  . Heart disease Neg Hx      Social History   Socioeconomic History  . Marital status: Single    Spouse name: Not on file  . Number of children: Not on file  . Years of education: Not on file  . Highest education level: Not on file  Occupational History    Comment: Teacher, adult education  Social Needs  . Financial resource strain: Not on file  . Food insecurity:    Worry: Not on file    Inability: Not on file  . Transportation needs:    Medical: Not on file    Non-medical: Not on file  Tobacco Use  . Smoking status: Never Smoker  . Smokeless tobacco: Never Used  Substance and Sexual Activity  . Alcohol use: Yes    Alcohol/week: 4.2 oz    Types: 7 Glasses of wine per week    Comment: Drinks Fri/Sat - 3-4 glasses of wine at a time  . Drug use: Yes  Comment: H/o prior cocaine use  . Sexual activity: Not on file  Lifestyle  . Physical activity:    Days per week: Not on file    Minutes per session: Not on file  . Stress: Not on file  Relationships  . Social connections:    Talks on phone: Not on file    Gets together: Not on file    Attends religious service: Not on file    Active member of club or organization: Not on file    Attends meetings of clubs or organizations: Not on file    Relationship status: Not on file  . Intimate partner violence:    Fear of current or ex partner: Not on file    Emotionally abused: Not on file    Physically abused: Not on file    Forced sexual activity: Not on file  Other Topics Concern  . Not on file  Social History Narrative  . Not on file     BP (!) 150/98   Pulse 94   Ht 5\' 11"  (1.803 m)   Wt (!) 330 lb (149.7 kg)   BMI 46.03 kg/m    Physical Exam:  Obese appearing middle aged man, NAD HEENT: Unremarkable Neck:  6 cm JVD, no thyromegally Lymphatics:  No adenopathy Back:  No CVA tenderness Lungs:  Clear with no wheezes HEART:  Regular rate rhythm, no murmurs, no rubs, no clicks Abd:  soft, positive bowel sounds, no organomegally, no rebound, no guarding Ext:  2 plus pulses, no edema, no cyanosis, no clubbing Skin:  No rashes no nodules Neuro:  CN II through XII intact, motor grossly intact  EKG - atrial fib with a controlled VR  Assess/Plan: 1. Atrial fib with a RVR - his rates are controlled. He refuses systemic anti-coagulation. 2. HTN - his blood pressure on my check was 150/70. He will continue his current med. I have asked him to lose weight. 3. Obesity - he states that he has lost and put back on over 30 lbs since I saw him. He is still down 5 lbs. 4. Chronic diastolic heart failure - his symptoms are fairly well compensated and he has not been in the hospital in the past year.  Luke Butler.D.

## 2018-02-20 NOTE — Patient Instructions (Signed)

## 2018-04-16 ENCOUNTER — Ambulatory Visit: Payer: Self-pay | Admitting: Internal Medicine

## 2018-09-12 ENCOUNTER — Encounter: Payer: Self-pay | Admitting: Emergency Medicine

## 2018-09-12 ENCOUNTER — Emergency Department: Payer: Self-pay

## 2018-09-12 ENCOUNTER — Other Ambulatory Visit: Payer: Self-pay

## 2018-09-12 ENCOUNTER — Emergency Department
Admission: EM | Admit: 2018-09-12 | Discharge: 2018-09-12 | Disposition: A | Discharge status: Home / Self Care | Payer: Self-pay | Attending: Emergency Medicine | Admitting: Emergency Medicine

## 2018-09-12 DIAGNOSIS — I110 Hypertensive heart disease with heart failure: Secondary | ICD-10-CM | POA: Insufficient documentation

## 2018-09-12 DIAGNOSIS — E790 Hyperuricemia without signs of inflammatory arthritis and tophaceous disease: Secondary | ICD-10-CM | POA: Insufficient documentation

## 2018-09-12 DIAGNOSIS — Z79899 Other long term (current) drug therapy: Secondary | ICD-10-CM | POA: Insufficient documentation

## 2018-09-12 DIAGNOSIS — Z7982 Long term (current) use of aspirin: Secondary | ICD-10-CM | POA: Insufficient documentation

## 2018-09-12 DIAGNOSIS — M79672 Pain in left foot: Secondary | ICD-10-CM | POA: Insufficient documentation

## 2018-09-12 DIAGNOSIS — I5020 Unspecified systolic (congestive) heart failure: Secondary | ICD-10-CM | POA: Insufficient documentation

## 2018-09-12 LAB — CBC WITH DIFFERENTIAL/PLATELET
ABS IMMATURE GRANULOCYTES: 0.04 10*3/uL (ref 0.00–0.07)
BASOS PCT: 1 %
Basophils Absolute: 0.1 10*3/uL (ref 0.0–0.1)
Eosinophils Absolute: 0.1 10*3/uL (ref 0.0–0.5)
Eosinophils Relative: 1 %
HCT: 42.2 % (ref 39.0–52.0)
Hemoglobin: 14.7 g/dL (ref 13.0–17.0)
IMMATURE GRANULOCYTES: 0 %
Lymphocytes Relative: 18 %
Lymphs Abs: 1.8 10*3/uL (ref 0.7–4.0)
MCH: 33 pg (ref 26.0–34.0)
MCHC: 34.8 g/dL (ref 30.0–36.0)
MCV: 94.6 fL (ref 80.0–100.0)
Monocytes Absolute: 1 10*3/uL (ref 0.1–1.0)
Monocytes Relative: 10 %
NEUTROS ABS: 6.9 10*3/uL (ref 1.7–7.7)
NEUTROS PCT: 70 %
PLATELETS: 200 10*3/uL (ref 150–400)
RBC: 4.46 MIL/uL (ref 4.22–5.81)
RDW: 13.5 % (ref 11.5–15.5)
WBC: 9.8 10*3/uL (ref 4.0–10.5)
nRBC: 0 % (ref 0.0–0.2)

## 2018-09-12 LAB — URIC ACID: URIC ACID, SERUM: 8.9 mg/dL — AB (ref 3.7–8.6)

## 2018-09-12 MED ORDER — COLCHICINE 0.6 MG PO TABS: 0.6000 mg | ORAL_TABLET | Freq: Every day | ORAL | 2 refills | Status: AC

## 2018-09-12 MED ORDER — HYDROCODONE-ACETAMINOPHEN 5-325 MG PO TABS: 1.0000 | ORAL_TABLET | Freq: Four times a day (QID) | ORAL | 0 refills | Status: DC | PRN

## 2018-09-12 NOTE — Discharge Instructions (Addendum)
Follow-up with a primary care provider or urgent care in [facility] if any continued problems. Ice and elevate your foot to reduce pain and swelling. Begin taking colchicine once daily with food.  Discontinue taking this medication if there is any stomach upset.  Hydrocodone every 6 hours as needed for pain.

## 2018-09-12 NOTE — ED Triage Notes (Signed)
Pt arrived via POV with reports of left foot pain that is worse this morning, pt has hx of the same pain in March, but was not sure of what caused the pain. Pt ambulatory to lobby with limp.

## 2018-09-12 NOTE — ED Notes (Signed)
See triage note  Presents with pain to top of left foot  Denies any injury  States pain started 2 days ago  Worse last pm  Good pulses States he was seen in past for same complaint

## 2018-09-12 NOTE — ED Provider Notes (Signed)
[facility] Emergency Department Provider Note   ____________________________________________   First MD Initiated Contact with Patient 09/12/18 1002     (approximate)  I have reviewed the triage vital signs and the nursing notes.   HISTORY  Chief Complaint Foot Pain   HPI Luke Butler is a 52 y.o. male presents to the ED with complaint of left foot pain that began approximately 1 day ago but became much worse this morning to the point he was unable to bear weight.  Patient is unsure of the cause but denies any injury.  He states that this happened in March and was seen in [facility].  He states that he was told that it was not gout and it went away with medication.  He states that a blood clot and x-ray were done.  He is unsure of any lab work.  Patient history of hypertension and atrial fib.  Currently rates his pain as a 10/10.  Past Medical History:  Diagnosis Date  . Chronic systolic CHF (congestive heart failure) (HCC)    a. Rex 08/21/2014: TEE / DCCV- EF: 30-35% / LV- normal size with moderate lobar hypokinesis / Mod MR / Successful DCCV to normal sinus rhythm.  . Essential hypertension   . History of cocaine use   . Morbid obesity (HCC)   . Non-compliance   . Obesity   . Paroxysmal atrial fibrillation (HCC)    a. Dx in [place] ([place]) in 2015, in setting of excess Diet Coke. s/p TEE/DCCV and then Tikosyn/Xarelto.   B. s/p unsuccessful DCCV 09/22/15: placed on amio. Plan for rate control and repeat DCCV in 1 month with amio     Patient Active Problem List   Diagnosis Date Noted  . Non-compliance   . Obesity   . PAF (paroxysmal atrial fibrillation) (HCC) 09/20/2015  . Atrial fibrillation with RVR (HCC) 09/20/2015  . Acute on chronic systolic (congestive) heart failure (HCC) 09/20/2015  . Atrial fibrillation (HCC) 09/20/2015  . Essential hypertension   . Morbid obesity (HCC)     Past Surgical History:  Procedure Laterality Date   . CARDIOVERSION N/A 09/22/2015   Procedure: CARDIOVERSION;  Surgeon: Chrystie Nose, MD;  Location: [facility] ENDOSCOPY;  Service: Cardiovascular;  Laterality: N/A;  . CARPAL TUNNEL RELEASE    . TEE WITHOUT CARDIOVERSION N/A 09/22/2015   Procedure: TRANSESOPHAGEAL ECHOCARDIOGRAM (TEE);  Surgeon: Chrystie Nose, MD;  Location: [facility] ENDOSCOPY;  Service: Cardiovascular;  Laterality: N/A;    Prior to Admission medications   Medication Sig Start Date End Date Taking? Authorizing Provider  aspirin 325 MG EC tablet Take 325 mg by mouth daily.    [provider]  carvedilol (COREG) 25 MG tablet Take 1 tablet (25 mg total) by mouth 2 (two) times daily. 02/20/18   Marinus Maw, MD  colchicine 0.6 MG tablet Take 1 tablet (0.6 mg total) by mouth daily. 09/12/18 09/12/19  Tommi Rumps, PA-C  diltiazem (CARTIA XT) 240 MG 24 hr capsule Take 1 capsule (240 mg total) by mouth daily. 02/20/18   Marinus Maw, MD  enalapril (VASOTEC) 5 MG tablet Take 1 tablet (5 mg total) by mouth 2 (two) times daily. 02/20/18   Marinus Maw, MD  furosemide (LASIX) 40 MG tablet Take 1 tablet (40 mg total) by mouth daily. 02/20/18   Marinus Maw, MD  HYDROcodone-acetaminophen (NORCO/VICODIN) 5-325 MG tablet Take 1 tablet by mouth every 6 (six) hours as needed for moderate pain. 09/12/18  Tommi Rumps, PA-C    Allergies Patient has no known allergies.  Family History  Problem Relation Age of Onset  . Heart disease Neg Hx     Social History Social History   Tobacco Use  . Smoking status: Never Smoker  . Smokeless tobacco: Never Used  Substance Use Topics  . Alcohol use: Yes    Alcohol/week: 7.0 standard drinks    Types: 7 Glasses of wine per week    Comment: Drinks Fri/Sat - 3-4 glasses of wine at a time  . Drug use: Yes    Comment: H/o prior cocaine use    Review of Systems Constitutional: No fever/chills Cardiovascular: Denies chest pain. Respiratory: Denies shortness of  breath. Musculoskeletal: Positive for left foot pain. Skin: Positive for erythema left foot. Neurological: Negative for headaches, focal weakness or numbness. ____________________________________________   PHYSICAL EXAM:  VITAL SIGNS: ED Triage Vitals  Enc Vitals Group     BP 09/12/18 0957 (!) 145/119     Pulse Rate 09/12/18 0957 76     Resp 09/12/18 0957 18     Temp 09/12/18 0957 97.8 F (36.6 C)     Temp Source 09/12/18 0957 Oral     SpO2 09/12/18 0957 97 %     Weight 09/12/18 0953 (!) 305 lb (138.3 kg)     Height 09/12/18 0953 5\' 11"  (1.803 m)     Head Circumference --      Peak Flow --      Pain Score 09/12/18 0952 10     Pain Loc --      Pain Edu? --      Excl. in GC? --    Constitutional: Alert and oriented. Well appearing and in no acute distress. Eyes: Conjunctivae are normal.  Head: Atraumatic. Nose: No congestion/rhinnorhea. Neck: No stridor.   Cardiovascular: Normal rate, regular rhythm. Grossly normal heart sounds.  Good peripheral circulation. Respiratory: Normal respiratory effort.  No retractions. Lungs CTAB. Gastrointestinal: Soft and nontender. No distention. Musculoskeletal: Examination of left foot there is moderate erythema noted on the medial aspect proximal to the first metatarsal.  Area is warm to touch and markedly tender to light palpation.  Skin is intact.  Pulse present.  No swelling or deformity is noted to the ankle.  Range of motion in the ankle is without restriction. Neurologic:  Normal speech and language. No gross focal neurologic deficits are appreciated.  Skin:  Skin is warm, dry and intact.  Erythema and warmth as noted above to the left foot. Psychiatric: Mood and affect are normal. Speech and behavior are normal.  ____________________________________________   LABS (all labs ordered are listed, but only abnormal results are displayed)  Labs Reviewed  URIC ACID - Abnormal; Notable for the following components:      Result Value    Uric Acid, Serum 8.9 (*)    All other components within normal limits  CBC WITH DIFFERENTIAL/PLATELET    RADIOLOGY  ED MD interpretation:   Left foot with calcaneal spur as noted by radiologist.  Official radiology report(s): Dg Foot Complete Left  Result Date: 09/12/2018 CLINICAL DATA:  Left foot pain. No known injury. EXAM: LEFT FOOT - COMPLETE 3+ VIEW COMPARISON:  None. FINDINGS: Mild to moderate inferior and posterior calcaneal spur formation. Otherwise, unremarkable bones and soft tissues. IMPRESSION: Calcaneal spurs. Otherwise, unremarkable examination. Electronically Signed   By: Beckie Salts M.D.   On: 09/12/2018 10:40    ____________________________________________   PROCEDURES  Procedure(s) performed: None  Procedures  Critical Care performed: No  ____________________________________________   INITIAL IMPRESSION / ASSESSMENT AND PLAN / ED COURSE  As part of my medical decision making, I reviewed the following data within the electronic MEDICAL RECORD NUMBER Notes from prior ED visits and [place] Controlled Substance Database  Patient presents to the ED with complaint of acute left foot pain with no history of injury.  Patient has history of hypertension and atrial fib.  Patient had history of same in March which resolved with medication.  At that time patient was told that it was not gout.  On exam today it is suspicious that he has gout or pseudogout due to erythema and warmth on examination of the left foot.  X-ray is positive only for a calcaneal spur.  Uric acid is elevated.  Patient was made aware and he will follow-up with a internal medicine person as he only has a cardiologist at this time.  He has plans to stay in [facility] and then drive to [facility] tomorrow.  Patient was given a prescription for colchicine 1 daily and Norco every 6 hours as needed for pain.  He was made aware to discontinue the colchicine if he has GI  upset.  ____________________________________________   FINAL CLINICAL IMPRESSION(S) / ED DIAGNOSES  Final diagnoses:  Acute foot pain, left  Elevated blood uric acid level     ED Discharge Orders         Ordered    HYDROcodone-acetaminophen (NORCO/VICODIN) 5-325 MG tablet  Every 6 hours PRN     09/12/18 1135    colchicine 0.6 MG tablet  Daily     09/12/18 1135           Note:  This document was prepared using Dragon voice recognition software and may include unintentional dictation errors.    Tommi Rumps, PA-C 09/12/18 1248    Jeanmarie Plant, MD 09/12/18 [phone]

## 2019-02-24 ENCOUNTER — Other Ambulatory Visit: Payer: Self-pay | Admitting: Internal Medicine

## 2019-03-06 ENCOUNTER — Telehealth: Payer: Self-pay | Admitting: Internal Medicine

## 2019-03-06 NOTE — Telephone Encounter (Signed)
New message   [facility] for pt to call back and schedule a virtual visit with Dr. Ladona Ridgel.

## 2019-04-29 ENCOUNTER — Other Ambulatory Visit: Payer: Self-pay | Admitting: Internal Medicine

## 2019-04-29 MED ORDER — DILTIAZEM HCL ER COATED BEADS 240 MG PO CP24: 240.0000 mg | ORAL_CAPSULE | Freq: Every day | ORAL | 0 refills | Status: DC

## 2019-04-29 NOTE — Telephone Encounter (Signed)
Pt's medication was sent to pt's pharmacy as requested. Confirmation received.  °

## 2019-05-09 ENCOUNTER — Other Ambulatory Visit: Payer: Self-pay | Admitting: Internal Medicine

## 2019-05-22 ENCOUNTER — Other Ambulatory Visit: Payer: Self-pay | Admitting: Internal Medicine

## 2019-05-30 ENCOUNTER — Other Ambulatory Visit: Payer: Self-pay | Admitting: Internal Medicine

## 2019-06-05 ENCOUNTER — Other Ambulatory Visit: Payer: Self-pay | Admitting: Internal Medicine

## 2019-06-05 MED ORDER — DILTIAZEM HCL ER COATED BEADS 240 MG PO CP24: 240.0000 mg | ORAL_CAPSULE | Freq: Every day | ORAL | 0 refills | Status: DC

## 2019-06-05 NOTE — Telephone Encounter (Signed)
Pt's medication was sent to pt's pharmacy as requested. Confirmation received.  °

## 2019-06-07 IMAGING — DX DG FOOT COMPLETE 3+V*L*
4 series · 4 of 4 positions shown · non-contrast
Comparison: None.

CLINICAL DATA: Left foot pain. No known injury.

EXAM:
LEFT FOOT - COMPLETE 3+ VIEW

[foot ap]
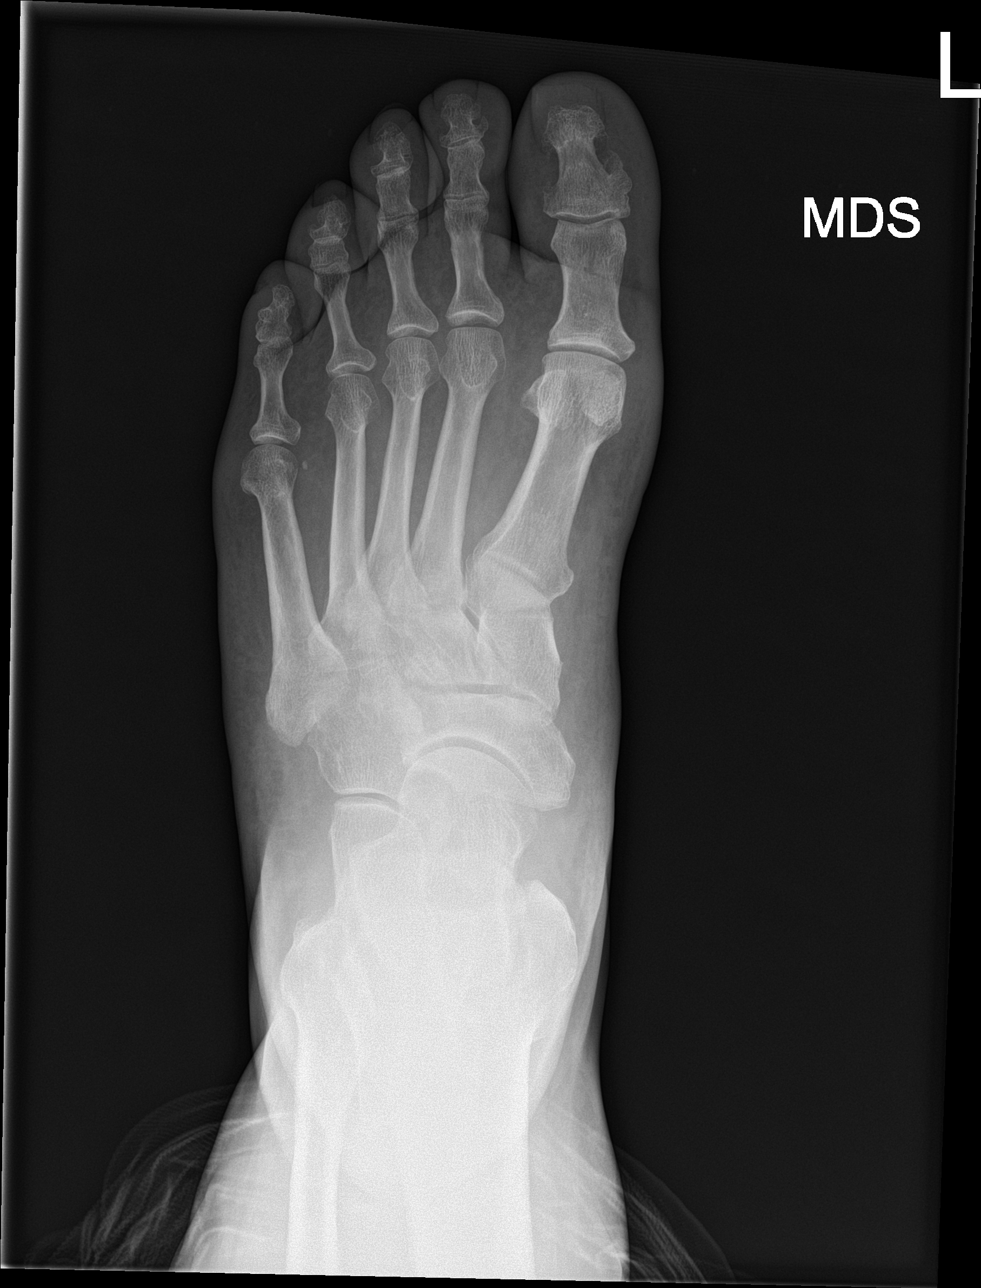

[foot obl]
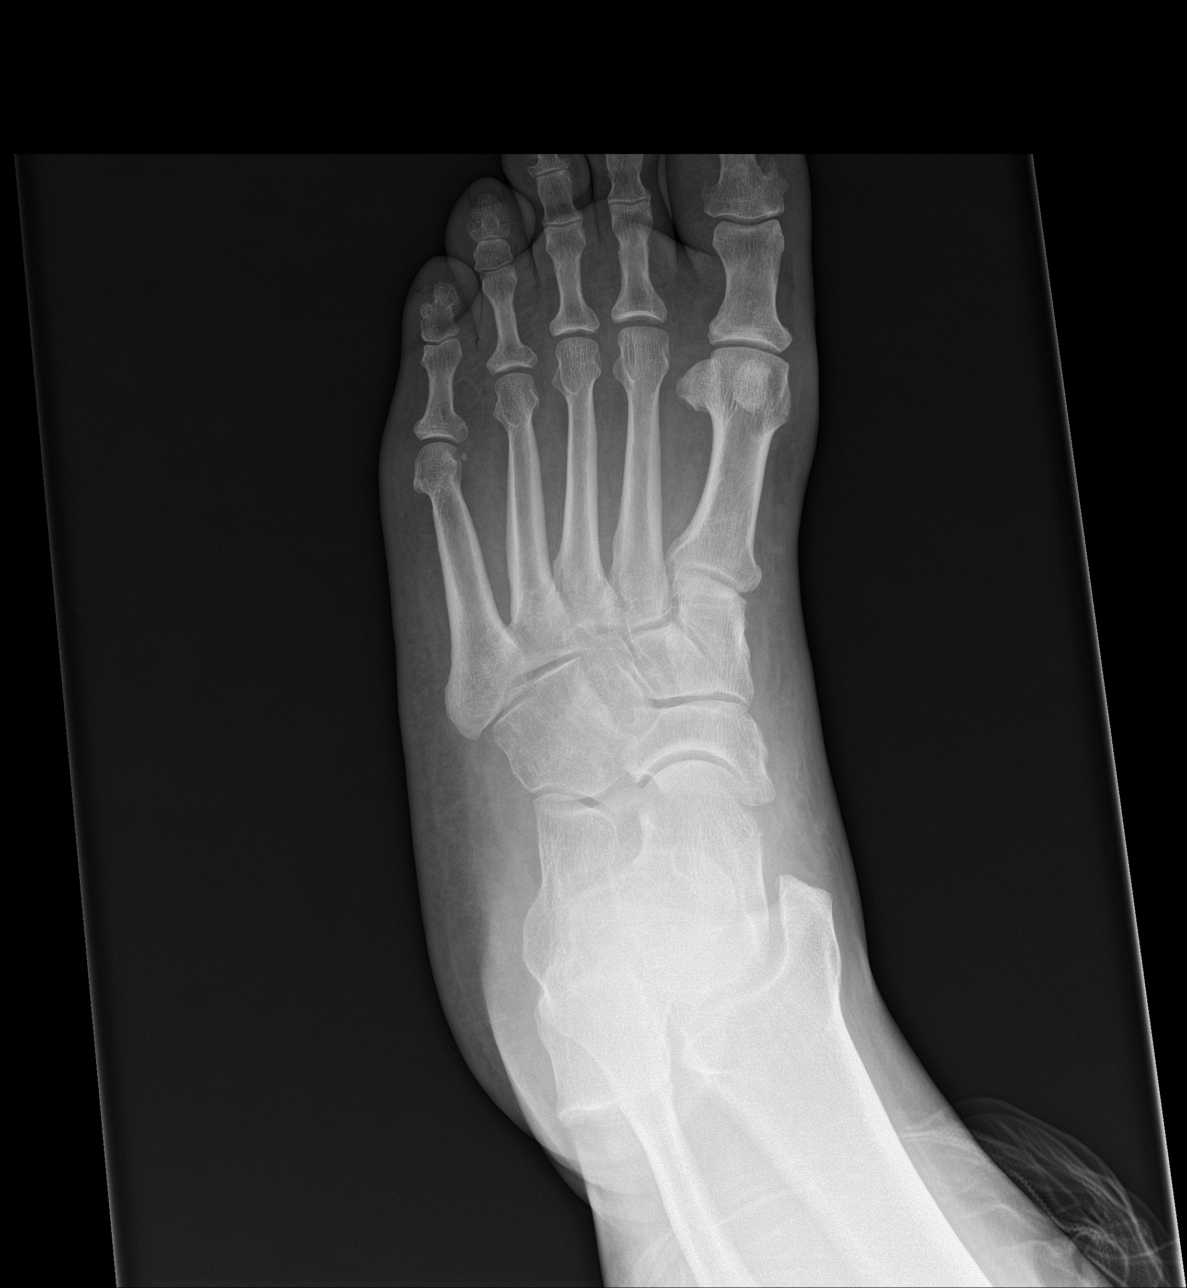

[foot lat (1 of 2)]
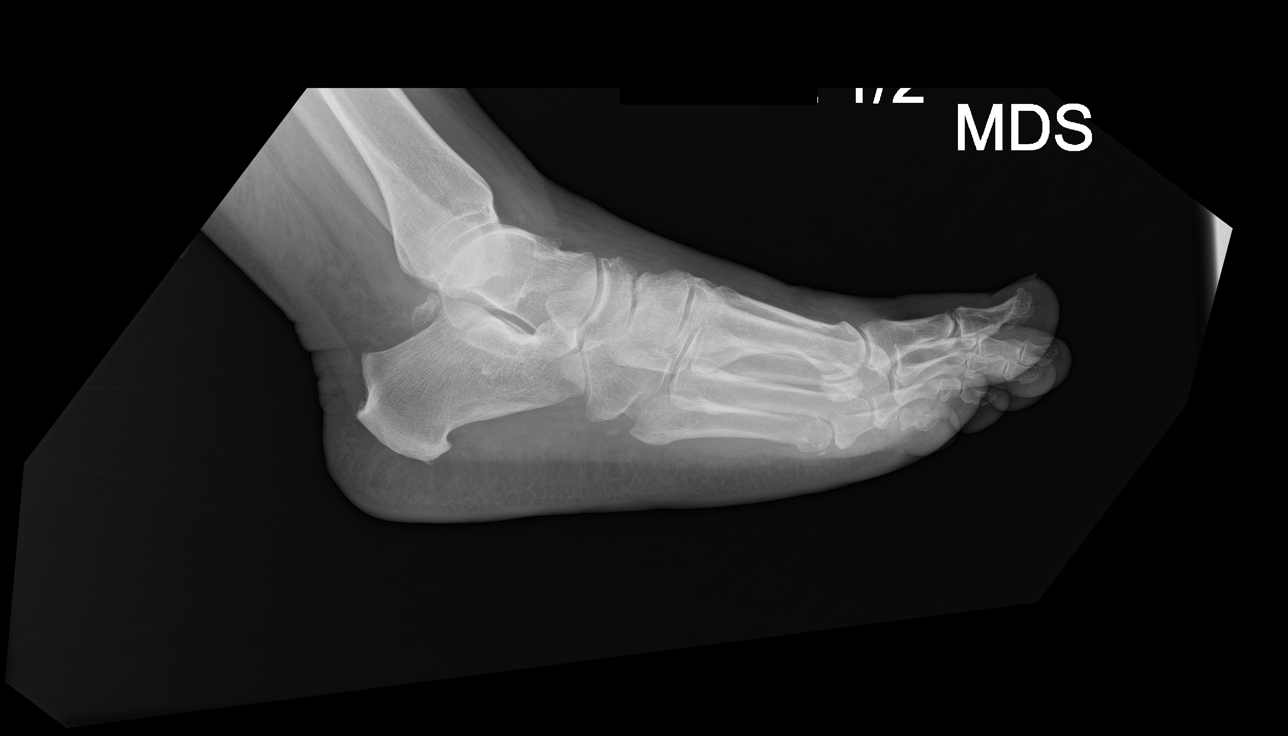

[foot lat (2 of 2)]
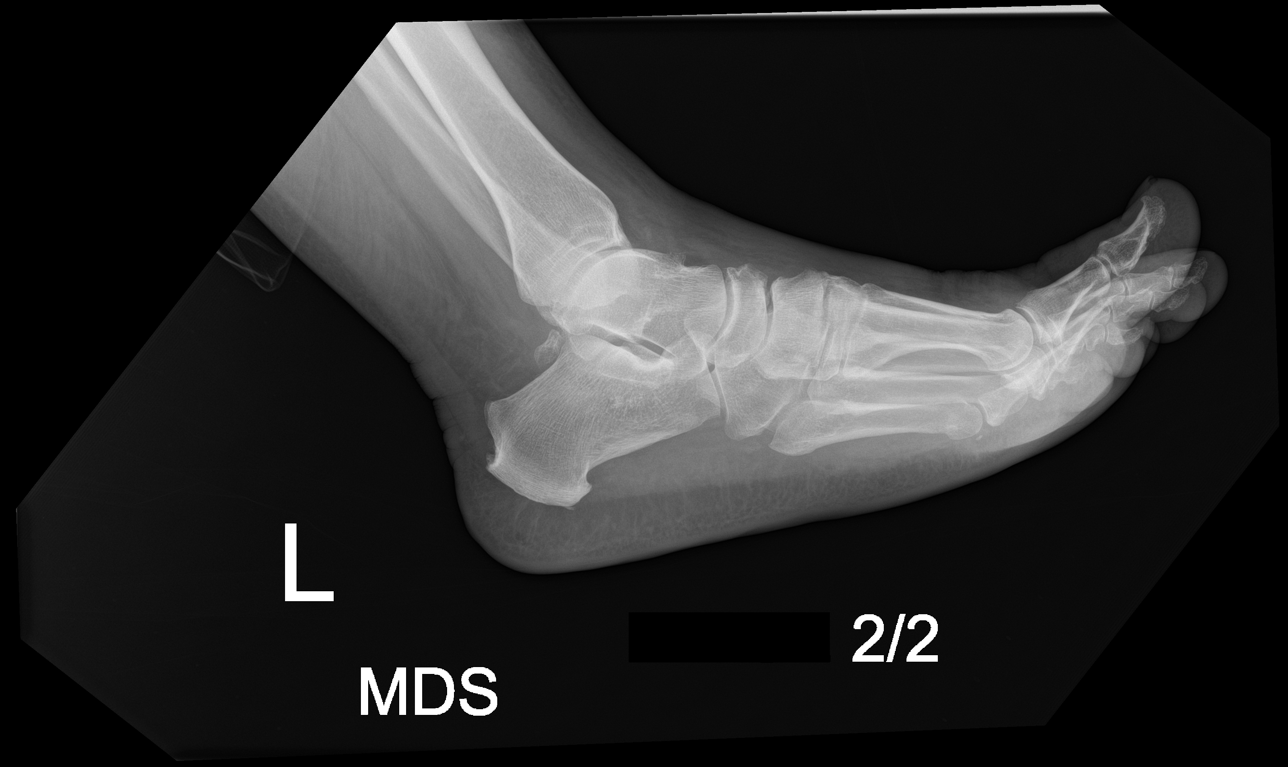

[4 of 4 positions shown; findings below may reference images not displayed]

FINDINGS: Mild to moderate inferior and posterior calcaneal spur formation.
Otherwise, unremarkable bones and soft tissues.
IMPRESSION: Calcaneal spurs. Otherwise, unremarkable examination.

## 2019-07-05 ENCOUNTER — Other Ambulatory Visit: Payer: Self-pay | Admitting: Internal Medicine

## 2019-08-13 ENCOUNTER — Ambulatory Visit (INDEPENDENT_AMBULATORY_CARE_PROVIDER_SITE_OTHER): Payer: PRIVATE HEALTH INSURANCE | Admitting: Internal Medicine

## 2019-08-13 ENCOUNTER — Encounter: Payer: Self-pay | Admitting: Internal Medicine

## 2019-08-13 ENCOUNTER — Other Ambulatory Visit: Payer: Self-pay

## 2019-08-13 VITALS — BP 110/70 | HR 107 | Ht 71.0 in | Wt 298.0 lb

## 2019-08-13 DIAGNOSIS — I4819 Other persistent atrial fibrillation: Secondary | ICD-10-CM | POA: Diagnosis not present

## 2019-08-13 DIAGNOSIS — I5032 Chronic diastolic (congestive) heart failure: Secondary | ICD-10-CM | POA: Diagnosis not present

## 2019-08-13 DIAGNOSIS — I10 Essential (primary) hypertension: Secondary | ICD-10-CM | POA: Diagnosis not present

## 2019-08-13 NOTE — Progress Notes (Signed)
HPI Mr. Luke Butler returns today for followup. He is a pleasant obese middle aged man with HTN, persistent atrial fib, and diastolic heart failure. He has done well in the interim except that his weight has been up and down. When the pandemic began in March, he stayed at home and ate and gained 30 lbs. He has lost about 15 back. The patient has not had palpitations and his breathing is at baseline. 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 No edema. No chest pain.  No Known Allergies   Current Outpatient Medications  Medication Sig Dispense Refill  . aspirin 325 MG EC tablet Take 325 mg by mouth daily.    . carvedilol (COREG) 25 MG tablet Take 1 tablet (25 mg total) by mouth 2 (two) times daily with a meal. Please keep upcoming appt with Dr. Ladona Ridgel before anymore refills. Thank you 180 tablet 0  . colchicine 0.6 MG tablet Take 1 tablet (0.6 mg total) by mouth daily. 7 tablet 2  . diltiazem (CARDIZEM CD) 240 MG 24 hr capsule Take 1 capsule (240 mg total) by mouth daily. Please keep upcoming appt with Dr. Ladona Ridgel before anymore refills. Thank you 90 capsule 0  . enalapril (VASOTEC) 5 MG tablet Take 1 tablet by mouth twice daily 150 tablet 1  . furosemide (LASIX) 40 MG tablet Take 1 tablet (40 mg total) by mouth daily. Please keep upcoming appt with Dr. Ladona Ridgel before anymore refills. Thank you 90 tablet 0   No current facility-administered medications for this visit.      Past Medical History:  Diagnosis Date  . Chronic systolic CHF (congestive heart failure) (HCC)    a. [place] 08/21/2014: TEE / DCCV- EF: 30-35% / LV- normal size with moderate lobar hypokinesis / Mod MR / Successful DCCV to normal sinus rhythm.  . Essential hypertension   . History of cocaine use   . Morbid obesity (HCC)   . Non-compliance   . Obesity   . Paroxysmal atrial fibrillation (HCC)    a. Dx in [place] ([place]) in 2015, in setting of excess Diet Coke. s/p  TEE/DCCV and then Tikosyn/Xarelto.   B. s/p unsuccessful DCCV 09/22/15: placed on amio. Plan for rate control and repeat DCCV in 1 month with amio     ROS:   All systems reviewed and negative except as noted in the HPI.   Past Surgical History:  Procedure Laterality Date  . CARDIOVERSION N/A 09/22/2015   Procedure: CARDIOVERSION;  Surgeon: Chrystie Nose, MD;  Location: [facility] ENDOSCOPY;  Service: Cardiovascular;  Laterality: N/A;  . CARPAL TUNNEL RELEASE    . TEE WITHOUT CARDIOVERSION N/A 09/22/2015   Procedure: TRANSESOPHAGEAL ECHOCARDIOGRAM (TEE);  Surgeon: Chrystie Nose, MD;  Location: [facility] ENDOSCOPY;  Service: Cardiovascular;  Laterality: N/A;     Family History  Problem Relation Age of Onset  . Heart disease Neg Hx      Social History   Socioeconomic History  . Marital status: Single    Spouse name: Not on file  . Number of children: Not on file  . Years of education: Not on file  . Highest education level: Not on file  Occupational History    Comment: Teacher, adult education  Social Needs  . Financial resource strain: Not on file  . Food insecurity    Worry: Not on file    Inability: Not on file  . Transportation needs  Medical: Not on file    Non-medical: Not on file  Tobacco Use  . Smoking status: Never Smoker  . Smokeless tobacco: Never Used  Substance and Sexual Activity  . Alcohol use: Yes    Alcohol/week: 7.0 standard drinks    Types: 7 Glasses of wine per week    Comment: Drinks Fri/Sat - 3-4 glasses of wine at a time  . Drug use: Yes    Comment: H/o prior cocaine use  . Sexual activity: Not on file  Lifestyle  . Physical activity    Days per week: Not on file    Minutes per session: Not on file  . Stress: Not on file  Relationships  . Social Herbalist on phone: Not on file    Gets together: Not on file    Attends religious service: Not on file    Active member of club or organization: Not on file    Attends meetings of clubs or  organizations: Not on file    Relationship status: Not on file  . Intimate partner violence    Fear of current or ex partner: Not on file    Emotionally abused: Not on file    Physically abused: Not on file    Forced sexual activity: Not on file  Other Topics Concern  . Not on file  Social History Narrative  . Not on file     BP 110/70   Pulse (!) 107   Ht 5\' 11"  (1.803 m)   Wt 298 lb (135.2 kg)   SpO2 95%   BMI 41.56 kg/m   Physical Exam:  stable appearing obese middle aged man, NAD HEENT: Unremarkable Neck:  6 cm JVD, no thyromegally Lymphatics:  No adenopathy Back:  No CVA tenderness Lungs:  Clear with no wheezes HEART:  Regular rate rhythm, no murmurs, no rubs, no clicks Abd:  soft, positive bowel sounds, no organomegally, no rebound, no guarding Ext:  2 plus pulses, no edema, no cyanosis, no clubbing Skin:  No rashes no nodules Neuro:  CN II through XII intact, motor grossly intact  EKG - atrial fib with a RVR/CVR   Assess/Plan: 1. Persistent atrial fib - his rates are fairly well controlled. He is not symptomatic. He continues to refuse systemic anti-coagulation. 2. Obesity - I encouraged the patient to lose weight. 3. Chronic diastolic heart failure - I asked that the patient avoid salty food, increase his exercise and continue his AV nodal blocking drugs.  4. HTN - his bp has been under control at home. He does have some white coat HTN.   Luke Butler.D.

## 2019-08-13 NOTE — Patient Instructions (Signed)

## 2019-08-16 ENCOUNTER — Other Ambulatory Visit: Payer: Self-pay | Admitting: Internal Medicine

## 2019-08-18 ENCOUNTER — Other Ambulatory Visit: Payer: Self-pay | Admitting: Internal Medicine

## 2019-08-18 MED ORDER — ENALAPRIL MALEATE 5 MG PO TABS: 5.0000 mg | ORAL_TABLET | Freq: Two times a day (BID) | ORAL | 3 refills | Status: DC

## 2019-10-13 ENCOUNTER — Other Ambulatory Visit: Payer: Self-pay | Admitting: Internal Medicine

## 2019-10-23 ENCOUNTER — Other Ambulatory Visit: Payer: Self-pay | Admitting: Internal Medicine

## 2020-03-22 ENCOUNTER — Other Ambulatory Visit: Payer: Self-pay | Admitting: Internal Medicine

## 2020-03-22 NOTE — Telephone Encounter (Signed)
Outpatient Medication Detail   Disp Refills Start End   CARTIA XT 240 MG 24 hr capsule 90 capsule 3 10/14/2019    Sig: Take 1 capsule by mouth once daily   Sent to pharmacy as: CARTIA XT 240 MG 24 hr capsule   E-Prescribing Status: Receipt confirmed by pharmacy (10/14/2019 10:20 AM EST)   Pharmacy  [id number] - [place], [place] - [address]

## 2020-07-17 ENCOUNTER — Other Ambulatory Visit: Payer: Self-pay | Admitting: Internal Medicine

## 2020-09-06 ENCOUNTER — Other Ambulatory Visit: Payer: Self-pay | Admitting: Internal Medicine

## 2020-10-07 ENCOUNTER — Other Ambulatory Visit: Payer: Self-pay | Admitting: Internal Medicine

## 2020-10-16 ENCOUNTER — Other Ambulatory Visit: Payer: Self-pay | Admitting: Internal Medicine

## 2020-11-19 ENCOUNTER — Other Ambulatory Visit: Payer: Self-pay | Admitting: Internal Medicine

## 2020-11-19 ENCOUNTER — Telehealth: Payer: Self-pay | Admitting: Internal Medicine

## 2020-11-19 MED ORDER — DILTIAZEM HCL ER COATED BEADS 240 MG PO CP24: 240.0000 mg | ORAL_CAPSULE | Freq: Every day | ORAL | 0 refills | Status: DC

## 2020-11-19 MED ORDER — FUROSEMIDE 40 MG PO TABS: 40.0000 mg | ORAL_TABLET | Freq: Every day | ORAL | 0 refills | Status: DC

## 2020-11-19 MED ORDER — CARVEDILOL 25 MG PO TABS: 25.0000 mg | ORAL_TABLET | Freq: Two times a day (BID) | ORAL | 0 refills | Status: DC

## 2020-11-19 MED ORDER — ENALAPRIL MALEATE 5 MG PO TABS: 5.0000 mg | ORAL_TABLET | Freq: Two times a day (BID) | ORAL | 0 refills | Status: DC

## 2020-11-19 NOTE — Telephone Encounter (Signed)
Pt's medication was sent to pt's pharmacy as requested. Confirmation received.  °

## 2020-11-19 NOTE — Telephone Encounter (Signed)
*  STAT* If patient is at the pharmacy, call can be transferred to refill team.   1. Which medications need to be refilled? (please list name of each medication and dose if known) aspirin 325 MG EC tablet carvedilol (COREG) 25 MG tablet colchicine 0.6 MG tablet(Expired) diltiazem (CARDIZEM CD) 240 MG 24 hr capsule enalapril (VASOTEC) 5 MG tablet furosemide (LASIX) 40 MG tablet 2. Which pharmacy/location (including street and city if local pharmacy) is medication to be sent to? [id number] - [place], [place] - [address]  3. Do they need a 30 day or 90 day supply? 90 day supply  Pt has an appt scheduled for 02/11/21

## 2021-02-11 ENCOUNTER — Other Ambulatory Visit: Payer: Self-pay

## 2021-02-11 ENCOUNTER — Ambulatory Visit (INDEPENDENT_AMBULATORY_CARE_PROVIDER_SITE_OTHER): Payer: PRIVATE HEALTH INSURANCE | Admitting: Internal Medicine

## 2021-02-11 ENCOUNTER — Encounter: Payer: Self-pay | Admitting: Internal Medicine

## 2021-02-11 VITALS — BP 124/80 | HR 97 | Ht 71.0 in | Wt 306.8 lb

## 2021-02-11 DIAGNOSIS — I480 Paroxysmal atrial fibrillation: Secondary | ICD-10-CM | POA: Diagnosis not present

## 2021-02-11 DIAGNOSIS — I5032 Chronic diastolic (congestive) heart failure: Secondary | ICD-10-CM

## 2021-02-11 DIAGNOSIS — I10 Essential (primary) hypertension: Secondary | ICD-10-CM | POA: Diagnosis not present

## 2021-02-11 NOTE — Progress Notes (Signed)
HPI Mr. Karis returns today for followup. He is a pleasant obese middle aged man with HTN, persistent atrial fib, and diastolic heart failure. He fell and broke three ribs and had a pneumothorax. The patient has not had palpitations and his breathing is at baseline. 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 No edema. No chest pain.  No Known Allergies   Current Outpatient Medications  Medication Sig Dispense Refill  . aspirin 325 MG EC tablet Take 325 mg by mouth daily.    . carvedilol (COREG) 25 MG tablet Take 1 tablet (25 mg total) by mouth 2 (two) times daily with a meal. Please keep upcoming appt in April 2022 with Dr. Ladona Ridgel before anymore refills. Thank you 180 tablet 0  . diltiazem (CARDIZEM CD) 240 MG 24 hr capsule Take 1 capsule (240 mg total) by mouth daily. Please keep upcoming appt in April 2022 with Dr. Ladona Ridgel before anymore refills. Thank you 90 capsule 0  . enalapril (VASOTEC) 5 MG tablet Take 1 tablet (5 mg total) by mouth 2 (two) times daily. Please keep upcoming appt in April 2022 with Dr. Ladona Ridgel before anymore refills. Thank you 180 tablet 0  . furosemide (LASIX) 40 MG tablet Take 1 tablet (40 mg total) by mouth daily. Please keep upcoming appt in April 2022 with Dr. Ladona Ridgel before anymore refills. Thank you 90 tablet 0  . colchicine 0.6 MG tablet Take 1 tablet (0.6 mg total) by mouth daily. 7 tablet 2   No current facility-administered medications for this visit.     Past Medical History:  Diagnosis Date  . Chronic systolic CHF (congestive heart failure) (HCC)    a. Rex 08/21/2014: TEE / DCCV- EF: 30-35% / LV- normal size with moderate lobar hypokinesis / Mod MR / Successful DCCV to normal sinus rhythm.  . Essential hypertension   . History of cocaine use   . Morbid obesity (HCC)   . Non-compliance   . Obesity   . Paroxysmal atrial fibrillation (HCC)    a. Dx in [place] ([place]) in 2015, in setting of  excess Diet Coke. s/p TEE/DCCV and then Tikosyn/Xarelto.   B. s/p unsuccessful DCCV 09/22/15: placed on amio. Plan for rate control and repeat DCCV in 1 month with amio     ROS:   All systems reviewed and negative except as noted in the HPI.   Past Surgical History:  Procedure Laterality Date  . CARDIOVERSION N/A 09/22/2015   Procedure: CARDIOVERSION;  Surgeon: Chrystie Nose, MD;  Location: [facility] ENDOSCOPY;  Service: Cardiovascular;  Laterality: N/A;  . CARPAL TUNNEL RELEASE    . TEE WITHOUT CARDIOVERSION N/A 09/22/2015   Procedure: TRANSESOPHAGEAL ECHOCARDIOGRAM (TEE);  Surgeon: Chrystie Nose, MD;  Location: [facility] ENDOSCOPY;  Service: Cardiovascular;  Laterality: N/A;     Family History  Problem Relation Age of Onset  . Heart disease Neg Hx      Social History   Socioeconomic History  . Marital status: Single    Spouse name: Not on file  . Number of children: Not on file  . Years of education: Not on file  . Highest education level: Not on file  Occupational History    Comment: Teacher, adult education  Tobacco Use  . Smoking status: Never Smoker  . Smokeless tobacco: Never Used  Substance and Sexual Activity  . Alcohol use: Yes    Alcohol/week: 7.0 standard drinks  Types: 7 Glasses of wine per week    Comment: Drinks Fri/Sat - 3-4 glasses of wine at a time  . Drug use: Yes    Comment: H/o prior cocaine use  . Sexual activity: Not on file  Other Topics Concern  . Not on file  Social History Narrative  . Not on file   Social Determinants of Health   Financial Resource Strain: Not on file  Food Insecurity: Not on file  Transportation Needs: Not on file  Physical Activity: Not on file  Stress: Not on file  Social Connections: Not on file  Intimate Partner Violence: Not on file     BP 124/80   Pulse 97   Ht 5\' 11"  (1.803 m)   Wt (!) 306 lb 12.8 oz (139.2 kg)   SpO2 92%   BMI 42.79 kg/m   Physical Exam:  Obese but well appearing NAD HEENT:  Unremarkable Neck:  No JVD, no thyromegally Lymphatics:  No adenopathy Back:  No CVA tenderness Lungs:  Clear with no wheezes HEART:  IRegular rate rhythm, no murmurs, no rubs, no clicks Abd:  soft, positive bowel sounds, no organomegally, no rebound, no guarding Ext:  2 plus pulses, no edema, no cyanosis, no clubbing Skin:  No rashes no nodules Neuro:  CN II through XII intact, motor grossly intact  EKG - atrial fib with a controlled VR  Assess/Plan: 1. Atrial fib - his VR is well controlled. No change in meds. 2. Coags - he is not on systemic anti-coagulation. He refuses warfarin and states that he cannot afford an OAC. I encouraged him to reconsider coumadin. He declines. He understands that his is at risk for stroke and that a blood thinner would help reduce that risk. 3. Obesity - I have strongly encouraged the patient to lose weight.  4. Chronic diastolic heart failure -his symptoms remain class 2. His biggest problem is the weight. I have also encouraged him to avoid salty food.  Charlean Carneal,MD

## 2021-02-11 NOTE — Patient Instructions (Signed)

## 2021-03-05 ENCOUNTER — Other Ambulatory Visit: Payer: Self-pay | Admitting: Internal Medicine

## 2021-03-18 ENCOUNTER — Other Ambulatory Visit: Payer: Self-pay | Admitting: Internal Medicine

## 2021-05-21 ENCOUNTER — Other Ambulatory Visit: Payer: Self-pay | Admitting: Internal Medicine

## 2021-06-10 ENCOUNTER — Other Ambulatory Visit: Payer: Self-pay | Admitting: Internal Medicine

## 2021-06-10 ENCOUNTER — Telehealth: Payer: Self-pay | Admitting: Internal Medicine

## 2021-06-10 MED ORDER — ENALAPRIL MALEATE 5 MG PO TABS: 5.0000 mg | ORAL_TABLET | Freq: Two times a day (BID) | ORAL | 3 refills | Status: AC

## 2021-06-10 NOTE — Telephone Encounter (Signed)
Enalapril refill has been sent to [facility].

## 2021-06-10 NOTE — Telephone Encounter (Signed)
*  STAT* If patient is at the pharmacy, call can be transferred to refill team.   1. Which medications need to be refilled? (please list name of each medication and dose if known)  enalapril (VASOTEC) 5 MG tablet  2. Which pharmacy/location (including street and city if local pharmacy) is medication to be sent to? [id number] - [place], [place] - [address]  3. Do they need a 30 day or 90 day supply? 90 day supply

## 2021-07-07 ENCOUNTER — Other Ambulatory Visit: Payer: Self-pay | Admitting: Internal Medicine

## 2021-08-27 ENCOUNTER — Other Ambulatory Visit: Payer: Self-pay | Admitting: Internal Medicine

## 2022-02-22 ENCOUNTER — Telehealth: Payer: Self-pay | Admitting: Internal Medicine

## 2022-02-22 NOTE — Telephone Encounter (Signed)
Outreach made to Mr. Luke Butler. ? ?FAX # is [phone].   ? ?Resent ?

## 2022-02-22 NOTE — Telephone Encounter (Signed)
Irven Coe, Coroner  from [facility] [employer] called. The patient was found deceased in a [place], [address] room when he did not check out of his hotel. ? ?The Coroner is requesting an H&P, List of Medical Problems, List of medications and the MD's Last OV note. ? ?Please fax information to [phone]. The phone number provided is Mr. Odis Hollingshead personal cell phone  ?

## 2022-02-22 NOTE — Telephone Encounter (Signed)
Discussed with management and Medical Records. ? ?Records faxed as requested. ?
